# Patient Record
Sex: Male | Born: 1960 | Race: White | Hispanic: No | Marital: Married | Smoking: Current every day smoker
Health system: Southern US, Community
[De-identification: ages and names within clinical notes are randomized; demographics above are authoritative.]

## PROBLEM LIST (undated history)

## (undated) DIAGNOSIS — J189 Pneumonia, unspecified organism: Secondary | ICD-10-CM

## (undated) DIAGNOSIS — E7800 Pure hypercholesterolemia, unspecified: Secondary | ICD-10-CM

## (undated) DIAGNOSIS — Z860100 Personal history of colon polyps, unspecified: Secondary | ICD-10-CM

## (undated) DIAGNOSIS — K5792 Diverticulitis of intestine, part unspecified, without perforation or abscess without bleeding: Secondary | ICD-10-CM

## (undated) DIAGNOSIS — M549 Dorsalgia, unspecified: Secondary | ICD-10-CM

## (undated) DIAGNOSIS — K625 Hemorrhage of anus and rectum: Secondary | ICD-10-CM

## (undated) DIAGNOSIS — I10 Essential (primary) hypertension: Secondary | ICD-10-CM

## (undated) DIAGNOSIS — M2550 Pain in unspecified joint: Secondary | ICD-10-CM

## (undated) DIAGNOSIS — Z720 Tobacco use: Secondary | ICD-10-CM

## (undated) DIAGNOSIS — K2970 Gastritis, unspecified, without bleeding: Secondary | ICD-10-CM

## (undated) DIAGNOSIS — I6523 Occlusion and stenosis of bilateral carotid arteries: Secondary | ICD-10-CM

## (undated) DIAGNOSIS — G4700 Insomnia, unspecified: Secondary | ICD-10-CM

## (undated) DIAGNOSIS — M1990 Unspecified osteoarthritis, unspecified site: Secondary | ICD-10-CM

## (undated) DIAGNOSIS — F32A Depression, unspecified: Secondary | ICD-10-CM

## (undated) DIAGNOSIS — K219 Gastro-esophageal reflux disease without esophagitis: Secondary | ICD-10-CM

## (undated) DIAGNOSIS — I739 Peripheral vascular disease, unspecified: Secondary | ICD-10-CM

## (undated) DIAGNOSIS — G4733 Obstructive sleep apnea (adult) (pediatric): Secondary | ICD-10-CM

## (undated) DIAGNOSIS — J309 Allergic rhinitis, unspecified: Secondary | ICD-10-CM

## (undated) DIAGNOSIS — I2510 Atherosclerotic heart disease of native coronary artery without angina pectoris: Secondary | ICD-10-CM

## (undated) DIAGNOSIS — F329 Major depressive disorder, single episode, unspecified: Secondary | ICD-10-CM

## (undated) DIAGNOSIS — I219 Acute myocardial infarction, unspecified: Secondary | ICD-10-CM

## (undated) HISTORY — DX: Tobacco use: Z72.0

## (undated) HISTORY — DX: Obstructive sleep apnea (adult) (pediatric): G47.33

## (undated) HISTORY — PX: CARPAL TUNNEL RELEASE: SHX101

## (undated) HISTORY — DX: Allergic rhinitis, unspecified: J30.9

## (undated) HISTORY — DX: Pure hypercholesterolemia, unspecified: E78.00

## (undated) HISTORY — DX: Gastro-esophageal reflux disease without esophagitis: K21.9

## (undated) HISTORY — DX: Peripheral vascular disease, unspecified: I73.9

## (undated) HISTORY — DX: Dorsalgia, unspecified: M54.9

## (undated) HISTORY — DX: Hemorrhage of anus and rectum: K62.5

## (undated) HISTORY — PX: TONSILLECTOMY: SUR1361

## (undated) HISTORY — PX: NASAL SEPTOPLASTY W/ TURBINOPLASTY: SHX2070

## (undated) HISTORY — DX: Diverticulitis of intestine, part unspecified, without perforation or abscess without bleeding: K57.92

## (undated) HISTORY — DX: Occlusion and stenosis of bilateral carotid arteries: I65.23

## (undated) HISTORY — PX: CORONARY ANGIOPLASTY: SHX604

## (undated) HISTORY — DX: Gastritis, unspecified, without bleeding: K29.70

## (undated) HISTORY — DX: Insomnia, unspecified: G47.00

## (undated) HISTORY — DX: Atherosclerotic heart disease of native coronary artery without angina pectoris: I25.10

## (undated) HISTORY — DX: Essential (primary) hypertension: I10

## (undated) HISTORY — PX: ADENOIDECTOMY: SUR15

## (undated) HISTORY — PX: CARDIAC CATHETERIZATION: SHX172

## (undated) HISTORY — PX: COLONOSCOPY: SHX174

---

## 1997-09-13 ENCOUNTER — Ambulatory Visit (HOSPITAL_BASED_OUTPATIENT_CLINIC_OR_DEPARTMENT_OTHER): Admission: RE | Admit: 1997-09-13 | Discharge: 1997-09-13 | Payer: Self-pay | Admitting: General Surgery

## 2000-11-30 ENCOUNTER — Encounter: Admission: RE | Admit: 2000-11-30 | Discharge: 2001-02-28 | Payer: Self-pay | Admitting: *Deleted

## 2001-02-24 ENCOUNTER — Encounter (INDEPENDENT_AMBULATORY_CARE_PROVIDER_SITE_OTHER): Payer: Self-pay | Admitting: *Deleted

## 2001-02-24 ENCOUNTER — Ambulatory Visit (HOSPITAL_BASED_OUTPATIENT_CLINIC_OR_DEPARTMENT_OTHER): Admission: RE | Admit: 2001-02-24 | Discharge: 2001-02-25 | Payer: Self-pay | Admitting: Otolaryngology

## 2001-08-30 ENCOUNTER — Encounter: Payer: Self-pay | Admitting: Emergency Medicine

## 2001-08-30 ENCOUNTER — Inpatient Hospital Stay (HOSPITAL_COMMUNITY): Admission: EM | Admit: 2001-08-30 | Discharge: 2001-09-01 | Payer: Self-pay | Admitting: Emergency Medicine

## 2002-01-24 ENCOUNTER — Inpatient Hospital Stay (HOSPITAL_COMMUNITY): Admission: AD | Admit: 2002-01-24 | Discharge: 2002-01-25 | Payer: Self-pay | Admitting: *Deleted

## 2002-01-25 ENCOUNTER — Encounter: Payer: Self-pay | Admitting: *Deleted

## 2002-06-27 ENCOUNTER — Encounter: Payer: Self-pay | Admitting: Emergency Medicine

## 2002-06-27 ENCOUNTER — Inpatient Hospital Stay (HOSPITAL_COMMUNITY): Admission: EM | Admit: 2002-06-27 | Discharge: 2002-06-28 | Payer: Self-pay | Admitting: Emergency Medicine

## 2002-10-17 ENCOUNTER — Ambulatory Visit (HOSPITAL_COMMUNITY): Admission: RE | Admit: 2002-10-17 | Discharge: 2002-10-17 | Payer: Self-pay | Admitting: *Deleted

## 2002-10-17 ENCOUNTER — Encounter: Payer: Self-pay | Admitting: *Deleted

## 2003-04-04 ENCOUNTER — Observation Stay (HOSPITAL_COMMUNITY): Admission: EM | Admit: 2003-04-04 | Discharge: 2003-04-05 | Payer: Self-pay | Admitting: Emergency Medicine

## 2003-10-06 ENCOUNTER — Emergency Department (HOSPITAL_COMMUNITY): Admission: EM | Admit: 2003-10-06 | Discharge: 2003-10-06 | Payer: Self-pay | Admitting: Emergency Medicine

## 2003-10-09 ENCOUNTER — Ambulatory Visit (HOSPITAL_COMMUNITY): Admission: RE | Admit: 2003-10-09 | Discharge: 2003-10-09 | Payer: Self-pay | Admitting: Family Medicine

## 2003-10-26 ENCOUNTER — Ambulatory Visit (HOSPITAL_COMMUNITY): Admission: RE | Admit: 2003-10-26 | Discharge: 2003-10-26 | Payer: Self-pay | Admitting: Orthopedic Surgery

## 2004-07-04 ENCOUNTER — Encounter: Admission: RE | Admit: 2004-07-04 | Discharge: 2004-07-04 | Payer: Self-pay | Admitting: *Deleted

## 2004-07-17 ENCOUNTER — Encounter (INDEPENDENT_AMBULATORY_CARE_PROVIDER_SITE_OTHER): Payer: Self-pay | Admitting: Specialist

## 2004-07-17 ENCOUNTER — Ambulatory Visit (HOSPITAL_COMMUNITY): Admission: RE | Admit: 2004-07-17 | Discharge: 2004-07-17 | Payer: Self-pay | Admitting: *Deleted

## 2005-01-04 ENCOUNTER — Emergency Department (HOSPITAL_COMMUNITY): Admission: EM | Admit: 2005-01-04 | Discharge: 2005-01-04 | Payer: Self-pay | Admitting: Emergency Medicine

## 2007-02-22 ENCOUNTER — Encounter: Admission: RE | Admit: 2007-02-22 | Discharge: 2007-02-22 | Payer: Self-pay | Admitting: Cardiology

## 2008-02-09 ENCOUNTER — Emergency Department (HOSPITAL_BASED_OUTPATIENT_CLINIC_OR_DEPARTMENT_OTHER): Admission: EM | Admit: 2008-02-09 | Discharge: 2008-02-09 | Payer: Self-pay | Admitting: Emergency Medicine

## 2008-04-24 ENCOUNTER — Inpatient Hospital Stay (HOSPITAL_COMMUNITY): Admission: RE | Admit: 2008-04-24 | Discharge: 2008-04-25 | Payer: Self-pay | Admitting: Interventional Cardiology

## 2008-04-24 ENCOUNTER — Inpatient Hospital Stay (HOSPITAL_BASED_OUTPATIENT_CLINIC_OR_DEPARTMENT_OTHER): Admission: RE | Admit: 2008-04-24 | Discharge: 2008-04-24 | Payer: Self-pay | Admitting: Cardiology

## 2010-10-14 NOTE — Cardiovascular Report (Signed)
NAME:  Thomas Dudley, Thomas Dudley NO.:  [id number]   MEDICAL RECORD NO.:  [id number]          PATIENT TYPE:  INP   LOCATION:  6524                         FACILITY:  [facility]   PHYSICIAN:  Armanda Magic, M.D.     DATE OF BIRTH:  04/18/1961   DATE OF PROCEDURE:  04/24/2008  DATE OF DISCHARGE:                            CARDIAC CATHETERIZATION   REFERRING PHYSICIAN:  Vikki Ports, MD   PROCEDURES:  1. Left heart catheterization.  2. Coronary angiography.  3. Left ventriculography.   OPERATOR:  Armanda Magic, MD   INDICATIONS:  Chest pain consistent with angina and known history of CAD  in the past with PCI of obtuse marginal branch in 2004.   COMPLICATIONS:  None.   IV ACCESS:  Via right femoral artery 6-French sheath.   IV MEDICATIONS:  Versed 2 mg, fentanyl 25 mcg, IV nitroglycerin drip at  10 mcg per minute, aspirin 325 mg, and IV heparin 3000 units IV bolus.   PROCEDURE:  This is a very pleasant 50 year old male who [facility] a fasting, nonsedated state.  Informed consent was  obtained.  The patient was connected to continuous heart rate, pulse  oximetry monitoring, and intermittent blood pressure monitoring.  The  right groin was prepped and draped in a sterile fashion.  Xylocaine 1%  was used for local anesthesia.  Using a modified Seldinger technique, a  4-French sheath was placed in the right femoral artery.  Under  fluoroscopic guidance, a 4-French JL4 catheter was placed in left  coronary artery.  Multiple cine films were taken in a 30-degree RAO and  40-degree LAO views.  This catheter was then exchanged out over a  guidewire for a 4-French 3D RCA catheter, which successfully engaged the  right coronary ostium.  Multiple cine films were taken at 30-degree RAO  and 40-degree LAO views.  This catheter was then exchanged out over a  guidewire for 6-French angled pigtail catheter, which was placed under a  fluoroscopic guidance in the  left ventricular cavity.  Left  ventriculography was performed in a 30-degree RAO view using total of 30  mL of contrast at 15 mL per second.  At the end of procedure, the  catheter was pulled back across the aortic valve with no significant  gradient noted.  The catheter was then removed over a guidewire.  The  sheath was sewn in place, and the patient was taken up to the main cath  lab for PCI of the left circumflex/PDA system.  The patient tolerated  the procedure well.   RESULTS:  Left main coronary artery is widely patent and trifurcates  into left anterior descending artery, ramus branch, and left circumflex  artery.   Left anterior descending artery is widely patent throughout the course  of the apex giving rise to a first diagonal branch and septal  perforator, both of which are small, but widely patent.   The ramus is a moderate-sized vessel and is widely patent.   The left circumflex is widely patent in its proximal portion.  It gives  rise  to a very large obtuse marginal one branch.  The distal circumflex  after the takeoff of the obtuse marginal branch traverses the AV groove  and is widely patent.  The obtuse marginal branch has a an 80-90%  stenosis just at the bifurcation of 2 daughter branches.  The superior  branch has 90% stenosis at the ostium of the inferior branch and 80-90%  stenosis at the ostium.   The right coronary artery has a 30% proximal narrowing and then gives  rise to a large acute marginal branch, which is widely patent.  The  ongoing RCA is widely patent distally.  It then bifurcates into  posterior descending artery and posterior lateral artery.  The posterior  lateral artery is widely patent.  The posterior descending artery has a  proximal 40% stenosis at a branch and then in the midportion has a 70-  80% narrowing.   Left ventriculography shows normal LV systolic function, ejection  fraction is 60%.  There are no regional wall motion  abnormalities.  LV  pressure 153/11 mmHg, aortic pressure 145/83 mmHg, and LVEDP 21 mmHg.   ASSESSMENT:  1. Two-vessel obstructive coronary artery disease of the posterior      descending artery and obtuse marginal branch.  Of note, the patient      has had a previous percutaneous coronary intervention of the obtuse      marginal in 2004.  2. Normal left ventricular function.  3. Unstable angina pectoris.   PLAN:  We will admit to the Inpatient Cath Lab for PCI of the left  circumflex/PDA per Dr. Eldridge Dace.  He has been loaded with aspirin 325 mg  and given 3000 units of IV heparin.  I have counseled the patient on the  need to quit his smoking.  In regards to his medications, he will  continue on his aspirin, simvastatin, hydrochlorothiazide, and will most  likely be on Plavix after his PCI.       Armanda Magic, M.D.  Electronically Signed     TT/MEDQ  D:  04/24/2008  T:  04/25/2008  Job:  [id number]   cc:   Vikki Ports, M.D.

## 2010-10-14 NOTE — Discharge Summary (Signed)
NAME:  COSTA, JHA NO.:  [id number]   MEDICAL RECORD NO.:  [id number]          PATIENT TYPE:  INP   LOCATION:  6524                         FACILITY:  [facility]   PHYSICIAN:  Armanda Magic, M.D.     DATE OF BIRTH:  05/09/1961   DATE OF ADMISSION:  04/24/2008  DATE OF DISCHARGE:  04/25/2008                               DISCHARGE SUMMARY   DISCHARGE DIAGNOSES:  1. Coronary artery disease, status post percutaneous coronary      intervention to the second obtuse marginal artery and right      coronary artery on April 24, 2008.  2. Tobacco abuse, smoking cessation counseling.  3. Dyslipidemia.  4. Hypertension.  5. Gastritis.  6. Head in 1965.   HOSPITAL COURSE:  Mr. Culp is a 50 year old male patient who went to  the office on the day of admission complaining of chest pain that had  been intermittent over the past week.  His symptoms were felt to be so  significant that given his known coronary artery disease, we felt he  should be admitted to the hospital.   LAB STUDIES DURING THIS HOSPITAL STAY:  Include a CBC showing a white  count 12.3, hemoglobin 15.4, hematocrit 44.4, and platelets 297.  Sodium  135, potassium 3.1, repleted, glucose 123, BUN 9, and creatinine 0.81.   The patient then underwent cardiac catheterization that showed an RCA  lesion of 80% and then in the OM-2 of 80%.  The vessels underwent  Endeavor stent placement and during the procedure distal dissection in  the right coronary artery with linear dye staining was noted and then an  Endeavor stent was placed in that area.  This was performed  successfully.  The patient needs to remain on aspirin and Plavix for at  least 1 year.   He remained in the hospital overnight and the following day he was felt  to be ready for discharge to home.   DISCHARGE MEDICATIONS:  1. Plavix 75 mg a day.  2. Lisinopril 10 mg a day.  3. Aspirin 325 mg a day.  4. Vytorin 10/40 one tablet daily.  5.  Hydrochlorothiazide 25 mg a day.  6. Wellbutrin SR 150 mg a day.  7. Nexium 40 mg a day.  8. Sublingual nitroglycerin p.r.n. chest pain.   The patient is to remain on a low-sodium, heart-healthy diet.  Increase  activity slowly.  Clean cath site gently with soap and water.  No  scrubbing.  Returning to work on May 02, 2008, the patient will need  to see Dr. Mayford Knife first before willing to back to work and the patient  will be seen on May 01, 2008, at 11:30 a.m.      Guy Franco, P.A.      Armanda Magic, M.D.  Electronically Signed    LB/MEDQ  D:  04/25/2008  T:  04/25/2008  Job:  [id number]   cc:   Armanda Magic, M.D.

## 2010-10-14 NOTE — Cardiovascular Report (Signed)
NAME:  Thomas Dudley, Thomas Dudley NO.:  [id number]   MEDICAL RECORD NO.:  [id number]          PATIENT TYPE:  INP   LOCATION:  6524                         FACILITY:  [facility]   PHYSICIAN:  Corky Crafts, MDDATE OF BIRTH:  1960-10-17   DATE OF PROCEDURE:  04/24/2008  DATE OF DISCHARGE:                            CARDIAC CATHETERIZATION   REFERRING PHYSICIAN:  Armanda Magic, MD   PROCEDURES PERFORMED:  1. Percutaneous coronary intervention of obtuse marginal artery 2.  2. Percutaneous coronary intervention of the posterior descending      artery.  3. Percutaneous coronary intervention of the distal right coronary      artery.   OPERATOR:  Corky Crafts, MD   INDICATION:  Unstable angina.   PROCEDURE NOTE:  The diagnostic procedure was performed by Dr. Mayford Knife  showing significant stenosis in the OM-2 and in the PDA.  A CLS 3.5  guiding catheter was placed into the left main and a Prowater wire was  placed across the lesion in the OM-2.  A 2.0 x 12 mm Apex was then  placed across the lesion and inflated to 10 atmospheres for 20 seconds  and then again to 12 atmospheres for 40 seconds.  A 2.5 x 14 Endeavor  was placed across the lesion and inflated to 9 atmospheres for 25  seconds.  The stent was postdilated with a 2.5 x 8 Quantum Maverick to  18 atmospheres for 35 seconds and then to 18 atmospheres for 22 seconds,  more proximally.  There was an excellent angiographic result with no  residual stenosis.  A JR4 guiding catheter was then placed into the  right coronary artery and the same Prowater wire was placed across the  lesion in the PDA.  A 2.0 x 12 Apex was used to predilate the lesion,  inflated to 14 atmospheres for 40 seconds and then again to 14  atmospheres for 15 seconds.  A 2.5 x 24 Endeavor stent was placed across  the lesion and deployed at 12 atmospheres for 35 seconds.  The proximal  portion of the stent was postdilated with 3.0 x 20 mm Quantum  Maverick  and inflated to 16 atmospheres for 20 seconds.  On what we thought would  be the final pictures of the angiogram, there was a linear dissection  noted in the distal right coronary artery with vessel staining.  This  was before the bifurcation of the PLA and the PDA.  It is unclear how  this dissection came as there were no balloon inflations in that area.  The dissected area was then directly stented with a 3.5 x 15 Endeavor  stent and inflated to 10 atmospheres for 28 seconds.  The stent was  postdilated with a 3.75 x 12 Quantum Maverick, inflated to 14  atmospheres for 45 seconds.  There was an excellent angiographic result  with no residual stenosis.   IMPRESSION:  1. Successful drug-eluting stent to the obtuse marginal artery 2,      posterior descending artery, distal right coronary artery.  The      obtuse marginal artery 2 received  a 2.5 x 14 Endeavor, the      posterior descending artery a 2.5 x 24 Endeavor, postdilated to      greater than 3 mm in diameter and the distal right received a 3.5 x      15 Endeavor, postdilated to greater than 3.75 mm in diameter.   RECOMMENDATIONS:  Continue aspirin and Plavix for at least a year.  He  needs to stop smoking.  He should continue with secondary prevention,  including lipid-lowering therapy.      Corky Crafts, MD  Electronically Signed     JSV/MEDQ  D:  04/24/2008  T:  04/25/2008  Job:  [id number]

## 2010-10-17 NOTE — H&P (Signed)
NAME:  Thomas Dudley, Thomas Dudley NO.:  [id number]   MEDICAL RECORD NO.:  [id number]                   PATIENT TYPE:  EMS   LOCATION:  MAJO                                 FACILITY:  [facility]   PHYSICIAN:  Colleen Can. Deborah Chalk, M.D.            DATE OF BIRTH:  14-Sep-1960   DATE OF ADMISSION:  06/27/2002  DATE OF DISCHARGE:                                HISTORY & PHYSICAL   REASON FOR ADMISSION:  Acute onset of chest pain.   HISTORY OF PRESENT ILLNESS:  The patient is a 50 year old white male with  previous catheterization and stent to the first obtuse marginal in April  2003.  He has similar symptoms at that time to his current symptoms.  In  August 2003, he was basically asymptomatic but had an abnormal stress study,  had repeat catheterization without significant abnormalities.  He has taken  an occasional nitroglycerin since that time.   He basically felt bad over the last one to two days.  He may have done an  excess amount of shoveling of snow, and he basically did not feel well on  his way to work.  He had the onset of severe substernal chest pain radiating  to his neck and his arms. There was no diaphoresis, but he was nauseated and  lightheaded.  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  He had 8/10 chest pain with relief with morphine and  nitroglycerin in the ER.   PAST MEDICAL HISTORY:  He smoked cigarettes two packs a day for 20 years, no  alcohol.   CURRENT MEDICATIONS:  Doses unknown.  Lisinopril, hydrochlorothiazide,  Plavix, Wellbutrin, and aspirin.   ALLERGIES:  NEOSPORIN causes severe blisters and has led to surgery in the  past.   PAST SURGICAL HISTORY:  Plastic surgery on his left head because of  Neosporin injury.   PAST MEDICAL HISTORY:  He has a history of hypertension for several years.  No diabetes.   SOCIAL HISTORY:  He is married for four years.  He has five children with  two marriages.   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  He also has a  second job.   FAMILY HISTORY:  His father died at age 52 of cirrhosis of the liver.  His  mother died at age 39 of heart disease.  He has a sister and brother with  diabetes.  He is the fourth of six children.   REVIEW OF SYSTEMS:  He has seen some blood on toilet tissue.  He has had  some right flank pain associated with nausea on three episodes.   PHYSICAL EXAMINATION:  VITAL SIGNS:  Blood pressure 127/75, heart rate 92,  respiratory rate 20.  HEENT:  Negative.  NECK:  Supple without bruits.  LUNGS:  Clear.  HEART: Regular rate and rhythm.  ABDOMEN:  Soft, nontender.  No organomegaly.  EXTREMITIES:  Without edema.   LABORATORY DATA:  EKG shows nonspecific ST and T wave changes.   Chest x-ray is pending.   OVERALL IMPRESSION:  Chest pain, known coronary artery disease with history  of stent in posterior circulation.   PLAN:  Will proceed on with acute catheterization.                                               Colleen Can. Deborah Chalk, M.D.    SNT/MEDQ  D:  06/27/2002  T:  06/27/2002  Job:  [id number]

## 2010-10-17 NOTE — Cardiovascular Report (Signed)
NAME:  Thomas Dudley, Thomas Dudley NO.:  [id number]   MEDICAL RECORD NO.:  [id number]                   PATIENT TYPE:  INP   LOCATION:  1827                                 FACILITY:  [facility]   PHYSICIAN:  Colleen Can. Deborah Chalk, M.D.            DATE OF BIRTH:  1961-05-06   DATE OF PROCEDURE:  06/27/2002  DATE OF DISCHARGE:                              CARDIAC CATHETERIZATION   HISTORY:  The patient had previous stent placed in his left circumflex  marginal vessel in April 2003.  He is referred now for acute  catheterization, presenting with 10/10 chest pain, similar to his previous  symptoms in April.  He has continued to smoke cigarettes.   PROCEDURE:  Left heart catheterization with selective coronary angiography,  left ventricular angiography.   TYPE AND SITE OF ENTRY:  Percutaneous right femoral artery.   CATHETERS:  The 6-French 4-curved Judkins right and left coronary catheters,  6-French pigtail ventriculographic catheter.   CONTRAST MATERIAL:  Omnipaque.   MEDICATIONS GIVEN PRIOR TO PROCEDURE:  Heparin 500 units IV, and morphine  sulfate.   MEDICATIONS GIVEN DURING PROCEDURE:  Versed 3 mg IV.   COMMENTS:  The patient tolerated the procedure well.   HEMODYNAMIC DATA:  1. The aortic pressure was 112/78.  2. LV was 115/7-13.  3. There was no aortic valve gradient noted on pullback.   ANGIOGRAPHIC DATA:  1. The left main coronary artery is normal.  2. Left circumflex:  The stent is widely patent.  There are irregularities     in the proximal left circumflex.  There is an irregularity in the obtuse     marginal distal to the stent with approximately 20-30% narrowing.  3. Left anterior descending:  The left anterior descending is a moderate-     sized vessel that basically bifurcates.  In the large diagonal vessel,     there is approximately 30-50% narrowing.  There are irregularities in the     smaller continuation branch of the left anterior  descending.  4. Right coronary artery:  The right coronary artery is a large dominant     vessel.  There are scattered irregularities in the right coronary artery,     and a 30-50% narrowing in the body of the posterior descending.  There is     no posterolateral obstructive disease.  5. Left ventricular angiogram:  A left ventricular angiogram is performed in     the RAO position.  Overall cardiac size and silhouette are normal.     Regional wall motion is normal.   OVERALL IMPRESSION:  1. Normal left ventricular function.  2. Mild diffuse three-vessel coronary atherosclerosis.  3. Persistent patency of the stent in the left circumflex coronary.   DISCUSSION:  In light of these findings, will continue medical management  for the patient with additional recommendations for smoking cessation and  lipid management, if tolerated.  Colleen Can. Deborah Chalk, M.D.    SNT/MEDQ  D:  06/27/2002  T:  06/27/2002  Job:  [id number]   cc:   Meade Maw, M.D.  [address]  [place]  [place] [address]  Fax: [phone]

## 2010-10-17 NOTE — Cardiovascular Report (Signed)
NAME:  Thomas Dudley, Thomas Dudley NO.:  [id number]   MEDICAL RECORD NO.:  [id number]                   PATIENT TYPE:  INP   LOCATION:  2018                                 FACILITY:  [facility]   PHYSICIAN:  Helen A. Fraser Din, M.D.              DATE OF BIRTH:  03-09-1961   DATE OF PROCEDURE:  DATE OF DISCHARGE:  01/25/2002                              CARDIAC CATHETERIZATION   INDICATIONS FOR PROCEDURE:  Ongoing chest pain with T wave changes in a 50-  year-old gentleman with known coronary artery disease status post PTCA of  the obtuse marginal.   DESCRIPTION OF PROCEDURE:  After obtaining written informed consent, the  patient was brought to the cardiac catheterization lab in the postabsorptive  state.  The right groin was prepped and draped in the usual sterile fashion.  Local anesthesia was achieved using 1% Xylocaine.  The patient received 3 mg  of Versed IV for sedation.  Single plane ventriculogram was performed in the  RAO position using a #6 French straight pigtail curved catheter.  Coronary  angiography was performed using a JL4/JR4 Judkins catheter.  All catheter  exchanges were made over a guidewire.  Following the procedure, there was no  identifiable disease.  The patient was transferred to the holding area.  The  hemostasis sheath was removed.  Hemostasis was achieved using digital  pressure.   FINDINGS:  1. There was no gradient noted on pullback.  2. A single plane ventriculogram revealed normal wall motion, ejection     fraction of approximately 60%.  There was no mitral regurgitation.  3. Coronary angiography     A. The left main coronary artery bifurcates.  There is no significant        disease in the left main coronary artery.     B. Left anterior descending:  The left anterior descending is a moderate        to large-sized vessel.  It does not supply the apical region.  The        left anterior descending gives rise to a large diagonal  which        bifurcates onto the left lateral wall.  There is no significant        disease in the large diagonal.  There are luminal irregularities up to        30% in the proximal LAD after the first septal perforator.     C. Circumflex vessel:  The circumflex vessel is a moderate-sized vessel.        The previously stented region remains patent.     D. Right coronary artery:  The right coronary artery is a large dominant        artery and provides circulation to the apical branch.  There is a 40-        50% lesion noted in the proximal PDA.   FINAL IMPRESSION:  1.  Preserved systolic function, ejection fraction of 60%.  2. Previously stented obtuse marginal remains patent.  3. There has been no progression of the lesion in the right coronary artery.   RECOMMENDATIONS:  Smoking cessation has been discussed at length with the  patient.  He will continue with aspirin.  His LDL goal is less than 100.  Will continue with Altace for his hypertension.                                               Helen A. Fraser Din, M.D.    HAP/MEDQ  D:  01/25/2002  T:  01/25/2002  Job:  [id number]

## 2010-10-17 NOTE — Cardiovascular Report (Signed)
[place]  Patient:    Thomas Dudley, Thomas Dudley Visit Number: [id number] MRN: [id number]          Service Type: MED Location: [facility] [id number] [id number] Attending Physician:  Mora Appl Dictated by:   Darci Needle, M.D. Proc. Date: 08/30/01 Admit Date:  08/30/2001 Discharge Date: 09/01/2001   CC:         [name] A. Fraser Din, M.D.  Jamesetta Geralds, M.D., [facility]   Cardiac Catheterization  INDICATION: Acute lateral wall myocardial infarction.  PROCEDURES PERFORMED: 1. Left heart catheterization. 2. Selective coronary angiography. 3. Left ventriculography. 4. Percutaneous transluminal coronary angioplasty and stent of obtuse marginal    #1.  DESCRIPTION OF PROCEDURE: After informed consent, the patient was brought to the catheterization lab. He received informed consent in the emergency room. The right ileofemoral region was sterilely prepped and draped. A 7 French sheath was placed in the right femoral artery using the modified Seldinger technique. A 6 French A2 Multipurpose catheter was used for hemodynamic recordings, left ventriculography by hand injection, and selective right coronary angiography. We used a #4 6 [place], left Judkins catheter for left coronary angiography. The patient tolerated the diagnostic procedure without complications. We identified the culprit lesion for the patients acute infarction to be the second obtuse marginal. We upgraded to a 7 [place], #4, left Judkins, guide catheter. We used a BMW 0.014, 190 cm, long wire and a 10 mm, long 2.5 mm diameter CrossSail balloon to perform angioplasty. Two balloon inflations were performed. We then stented the proximal portion of the second obtuse marginal using an 8 mm, long, Express II stent to 15 atmospheres, which was an affective diameter of 2.2 mm. The patient had reproducible chest discomfort with each balloon inflation.  TIMI-3 flow was noted post  intervention.  The patient received intravenous heparin in the emergency room, 5000 units. ACT upon arrival in the catheterization lab was 265 seconds.  A double bolus followed by an infusion of Integrilin was then given. The patient had received 150 mg of Plavix in the emergency room. The procedure was terminated. No complications were noted and his clinical condition was improved at completion of the procedure.  RESULTS:  I: HEMODYNAMIC DATA:     a. The aortic pressure 117/80 mmHg.     b. Left ventricular pressure 117/15 mmHg.  II: LEFT VENTRICULOGRAPHY: The left ventricle is normal in size. There was mild anterolateral hypokinesis. EF is greater than 60%.  III: SELECTIVE CORONARY ANGIOGRAPHY:     a. Left main: Normal.     b. Left anterior descending coronary artery: The LAD is large. It        reaches the left ventricular apex anteriorly. It does not wrap        around the apex. There is a large first diagonal that arises from        the LAD in the mid vessel beyond the first septal perforator. The        origin of the continuation of the LAD beyond this diagonal may contain        up to 30% narrowing. The diagonal is large, bifurcates on the left        lateral wall and is free of any significant obstruction. No        irregularities are noted throughout the LAD but no high-grade stenosis        is seen. After the first septal perforator, there is perhaps 30-40%  narrowing.     c. Circumflex artery: The circumflex is moderate in size. It gives origin        to three obtuse marginal branches. The first obtuse marginal branch        is patent. It is relatively small. The second obtuse marginal is large        and bifurcates. It contains a 99% thrombus containing proximal        stenosis. The third obtuse marginal is relatively small. The second        obtuse marginal beyond its bifurcation contains a 30-40% region of        narrowing.     d. Right coronary artery: The  right coronary artery is dominant. The        PDA wraps around the left ventricular apex. A large left ventricular        branch also arises from the right coronary. Irregularities are noted        within the right coronary. A 50% stenosis is noted in the proximal        PDA.  CONCLUSIONS: 1. Acute myocardial infarction due to obtuse marginal #2 obstruction/acute    thrombus. 2. Successful percutaneous intervention with reduction in stenosis on the    second obtuse marginal from 99% to 0% following stenting. TIMI grade    3 flow was maintained. 3. Mild left anterior descending and moderate posterior descending artery    disease. 4. Normal overall left ventricular function. Mild anterolateral wall motion    abnormality.  RECOMMENDATIONS: Aggressive risk factor modification, Integrilin x18 hours, aspirin and Plavix, both at three weeks, then aspirin thereafter. Low-dose beta blocker therapy. Further management per Dr. Fraser Din. Dictated by:   Darci Needle, M.D. Attending Physician:  Meade Maw A DD:  08/30/01 TD:  08/30/01 Job: [id number]

## 2010-10-17 NOTE — H&P (Signed)
NAME:  Thomas Dudley, Thomas Dudley NO.:  [id number]   MEDICAL RECORD NO.:  [id number]                   PATIENT TYPE:  INP   LOCATION:  2018                                 FACILITY:  [facility]   PHYSICIAN:  Helen A. Fraser Din, M.D.              DATE OF BIRTH:  21-Feb-1961   DATE OF ADMISSION:  01/24/2002  DATE OF DISCHARGE:                                HISTORY & PHYSICAL   INDICATION FOR ADMISSION:  Unstable angina.   HISTORY:  The patient is a 50 year old male who is known to me from a  previous admission for acute lateral wall myocardial infarction. He  underwent left heart catheterization in April 2003 and subsequent PTCA of  the  first obtuse marginal. His ejection fraction was preserved at 60%. He  was  noted to have mild anterolateral hypokinesis and his peak CK was 159  with an MB fraction of 5.5. His last evaluation was on January 18, 2002. At  this time he noticed that he was  having  two to  three episodes of chest  pain. Since his last visits he had used three sublingual nitroglycerin  without resolution of the  pain. He continues to  smoke.   He presents today to the  office for a stress Cardiolite. He was  noted to  have acute ST elevation in 1 and AVL. The chest pain and ST changes resolved  with one sublingual nitroglycerin spray. In view of these changes, it is  elected to admit and go  directly for coronary angiography.   PAST MEDICAL HISTORY:  1. As noted above.  2. Dyslipidemia, last serum cholesterol revealing a triglyceride of 331, HDL     42 and LDL of 103. He was  initiated on Zocor 10 mg q.d.  3. Hypertension with good control. He remains on Altace.   PAST SURGICAL HISTORY:  Includes tonsillectomy and adenoidectomy.   SOCIAL HISTORY:  The patient is married. He has three biological children  and two step children. He works three jobs, working one for a Technical sales engineer, and unloading trucks. Occasional beer. Smokes two packs per day.  Has been unable to stop smoking despite repeated advice to discontinue.   ALLERGIES:  NEOSPORIN RESULTS IN  SEVERE TOPICAL REACTION.   REVIEW OF SYMPTOMS:  As noted above. He has had no  orthopnea, no PND, no  tachy arrhythmia.   PHYSICAL EXAMINATION:  VITAL SIGNS:  Initial blood pressure  160/100. He was  given 2 sublingual nitroglycerin, heart rate is ranging in the 80s.  HEENT: Unremarkable.  NECK: He has good carotid upstrokes and no carotid bruits.  CORONARY:  Exam reveals breath sounds which are equal and clear to  auscultation. No use of accessory muscles.  Cardiovascular exam reveals a  normal S1, S2, regular rate and rhythm. There are no murmurs, rubs, gallops  noted.  ABDOMEN:  Soft, benign, nontender, no unusual  bruits or pulsations are  noted.  EXTREMITIES:  Reveal no peripheral edema.  SKIN:  Warm and dry.  NEUROLOGIC:  Nonfocal. Motor is 5/5 throughout.   LABORATORY DATA:  His initial EKG revealed ST elevation in 1 and AVL. There  is T-wave inversion in V6. Following 2 sublingual nitroglycerin, the acute  ST elevation in AVL has resolved. He continues to have T-wave in the V6  lead.   IMPRESSION:  1. Coronary artery disease, status post PTCA of the  obtuse marginal with     residual disease in the PDA. He presents with increasing episodes of     angina. He will be admitted. Options were discussed with the patient. He     wishes to proceed directly with coronary angiography. He will be started     on Lovenox, continue with his current medications for hypertension, which     include Toprol 50 mg and Altace 10 mg daily.   1. Hypertension. Blood pressure is not well controlled as noted above. Will     increase his Toprol to 100 mg and add hydrochlorothiazide.   1. Ongoing tobacco use. Smoking cessation was once again discussed with the     patient.                                               Helen A. Fraser Din, M.D.    HAP/MEDQ  D:  01/24/2002  T:  01/25/2002   Job:  [id number]   cc:   Janifer Adie, M.D.

## 2010-10-17 NOTE — Discharge Summary (Signed)
NAME:  Thomas Dudley, Thomas Dudley NO.:  [id number]   MEDICAL RECORD NO.:  [id number]                   PATIENT TYPE:  INP   LOCATION:  2901                                 FACILITY:  [facility]   PHYSICIAN:  Meade Maw, M.D.                 DATE OF BIRTH:  [birth year]   DATE OF ADMISSION:  06/27/2002  DATE OF DISCHARGE:  06/28/2002                                 DISCHARGE SUMMARY   ADMISSION DIAGNOSES:  1. Chest pain, rule out myocardial infarction.  2. Known coronary artery disease, status post first obtuse marginal stenting     in April 2003.  3. Hyperlipidemia.  4. Tobacco use.  5. Hypertension.   DISCHARGE DIAGNOSES:  1. Chest pain, resolved, probable coronary spasm.  Coronaries patent,     myocardial infarction ruled out.  2. Known coronary artery disease, status post first obtuse marginal stenting     in April 2003.  3. Hyperlipidemia.  4. Tobacco use.  5. Hypertension.   HISTORY OF PRESENT ILLNESS:  This is a 50 year old white male with a prior  catheterization and stent to the first obtuse marginal in April 2003.  He  had similar symptoms at that time to his current symptoms.  He underwent  repeat cardiac catheterization August 2003 for an abnormal stress test which  revealed no significant abnormalities on catheterization.  He has taken a  couple of nitroglycerin since that time.   Apparently, the patient has felt poorly over the last 1-2 days.  He may have  done an excessive amount of shoveling of snow and he basically did not feel  well on his way to work.  He had onset of severe substernal chest pain  radiating to his neck and arms.  There was no diaphoresis but he was  lightheaded and nauseated.  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  He had 8/10 chest pain with relief  taking morphine and nitroglycerin in the emergency room.   In the emergency room, EKG showed nonspecific ST-T wave changes.   Because of  chest pain with history of stenting one year ago, we will proceed with acute  catheterization by Dr. Deborah Chalk.   PROCEDURE:  Cardiac catheterization by Dr. Deborah Chalk June 27, 2002.   CONSULTATIONS:  None.   HOSPITAL COURSE:  The patient was admitted to [facility] on June 27, 2002 with a diagnosis of acute onset chest pain.  Because of his  symptoms, he was taken urgently to the cardiac catheterization.  Dr. Deborah Chalk  performed cardiac catheterization which revealed:   1. A normal left main.  2. Circumflex with a stent widely patent.  Diffuse proximal irregularities     of the circumflex.  A 20%-30% stenosis distal to the marginal stent.  3. LAD moderate size with a large diagonal vessel having 30%-50% narrowing.  4. RCA is large  and dominant, scattered irregularities, and a 30% narrowing     in the body of the PDA.  There was no posterolateral obstructive disease.     Normal left ventricular function.   In light of these findings, Dr. Deborah Chalk recommended continued medical  management and recommended smoking cessation and lipid management.   On June 28, 2002, the patient remained stable without chest pain.  He was  seen by Dr. Fraser Din, who felt that he was stable for discharge to home.  She  recommended Cardizem CD 180 mg a day for anticoronary vasospasm benefit.  Also, reinstate Zocor at 20 mg a day.  He may return to work on Monday.   LABORATORY DATA:  Laboratory studies at discharge revealed a WBC of 9.8,  hemoglobin 14.8, and platelets 280.  Sodium 131, potassium 3.4, BUN 12,  creatinine 0.9 (potassium will be repleted before discharge).  Cardiac  enzymes negative x2.   DISCHARGE MEDICATIONS:  1. Enteric-coated aspirin 325 mg a day.  2. Cardizem CD 180 a day.  3. Hydrochlorothiazide 25 mg a day.  4. Lisinopril 20 mg a day.  5. Plavix 75 mg a day.  6. Wellbutrin 150 mg b.i.d.  7. Zocor 20 mg a day.  8. Toprol XL 25 mg a day.  9. Nitroglycerin as  needed for chest pain.   DISCHARGE INSTRUCTIONS:  He is to stop smoking.  No strenuous activity or  lifting more than 5 pounds or driving for two days.  He may return to work  on Monday, July 03, 2002, without restriction.   DIET:  Low-fat/low-cholesterol/low-salt diet.   WOUND CARE:  He may shower.   FOLLOW UP:  He is asked to call the office with any problems or questions.  He will follow up with Dr. Fraser Din on Wednesday, February 11 at 12 o'clock.     Georgiann Cocker Jernejcic, P.A.                   Meade Maw, M.D.    TCJ/MEDQ  D:  06/28/2002  T:  06/28/2002  Job:  [id number]   cc:   Janifer Adie, M.D.

## 2010-10-17 NOTE — Discharge Summary (Signed)
[place]  Patient:    Thomas Dudley, Thomas Dudley Visit Number: [id number] MRN: [id number]          Service Type: MED Location: [facility] 2928 01 Attending Physician:  Meade Maw A Dictated by:   Anselm Lis, N.P. Admit Date:  08/30/2001                             Discharge Summary  DATE OF BIRTH:  Dec 22, 1960.  PRIMARY CARE PHYSICIAN:  Jamesetta Geralds, M.D.  CONSULTANTS:  Dr. Verdis Prime.  PROCEDURES: 1. Dr. Verdis Prime, percutaneous transluminal coronary angioplasty,    stenting of obtuse marginal #1, reduction of stenosis from 99% to 0%.    a.  Ejection fraction 60% with mild anterior lateral hypokinesis. Residual    50% posterior descending artery.  DISCHARGE DIAGNOSES: 1. Acute lateral myocardial infarction with spontaneous reperfusion:    a. Very slight bump in cardiac enzymes with peak CK 159, MB fraction    5.5, relative index 3.5.    b. (08/30/01) Percutaneous transluminal coronary angioplasty stent of    obtuse marginal #1 by Dr. Verdis Prime, details as above. 2. Dyslipidemia with serum cholesterol 211, triglycerides 331, HDL 42,    LDL 103. Started on Zocor 10 mg per day and will have follow up    liver function tests, fasting statin profile in six weeks.  The liver    function tests this admission were within normal range. 3. Hypertension: Good control during course of this admission. 4. Tobacco abuse.  Patient met with smoking cessation counselor, ultimately    planning to quit after tapering schedule.  Wife will join the patient in    this attempt.  Smoking cessation counselor plans follow up with patient    after discharge. 5. Hyperglycemia ? stress response with admission glucose 146. Fasting    glucose 08/31/01 was 95.  Hemoglobin A1C was within normal range at 5.9.  PLAN:  Patient discharged home in stable condition.  DISCHARGE MEDICATIONS: 1. (New) Plavix 75 mg one p.o. q.d. for one month, take with food. 2. Enteric coated aspirin  325 mg one q.d. 3. Nitroglycerin tablets 0.4 mg one sublingual p.r.n. chest pain up to    three tablets in 15 minutes. 4. (New) Zocor 10 mg p.o. q.h.s.  ACTIVITY:  May return to work 09/12/01.  Plans to enroll in outpatient cardiac rehabilitation.  DIET:  Low fat, low cholesterol.  WOUND CARE:  May shower.  SPECIAL INSTRUCTIONS: 1. Patient to call clinic if he develops large amount of swelling or bruising    in groin area. 2. Smoking cessation using tapering method. 3. Patient will need homocysteine level prior to return office visit with    Dr. Fraser Din to decide if his children need to be checked.  FOLLOW UP: 1. Follow up with Dr. Meade Maw Wednesday 09/14/01 at 9:30 a.m. 2. Our office will call the patient for follow up fasting statin profile    in approximately six weeks.  HISTORY OF PRESENT ILLNESS:  The patient is a very pleasant 50 year old gentleman with positive family history of coronary artery disease, personal history of heavy tobacco use and borderline hypertension.  The patient awoke the morning of admission about 6 oclock developing left lower substernal chest pressure which was constant.  There was a sharp component to pain. Discomfort radiated to his left shoulder which with tingling in his left arm. This was associated with shortness of breath,  diaphoresis and nausea.  His wife noted he looked pale.  He was reported to the [facility] emergency department where electrocardiogram revealed ST elevation with sloping ST segment approximately 1 mm V2, J point elevation with upsloping ST segment V3 and V4.  He was treated with intravenous heparin, bolus drip, sublingual nitrates, 150 mg Plavix, four baby aspirin and was taken for coronary angiography by Dr. Verdis Prime with results as above.  Post angioplasty his electrocardiogram changes normalized.  HOSPITAL COURSE:  Hospital course was uneventful and is detailed above.  LABORATORY DATA:  CBC:  On  admission white blood cell count 7.8, hemoglobin 16, hematocrit 45.5, platelet count 298K.  Admission coags within normal range.  ELECTROLYTES:  Sodium 137, potassium 3.5, chloride 105, cO2 24, glucose 146; 95 at time of discharge.  BUN 6, creatinine 0.9.  LIVER FUNCTION TESTS:  All within normal range.  CARDIAC ENZYMES:  Serial cardiac enzymes first set CK 133, MB fraction 1.7, troponin-I 0.03.  Second set CK 109, MB fraction 3.4, third CK 159, MB fraction 5.5, fourth CK 118, MB fraction 4.4.  LIPIDS:  Cholesterol 211, triglycerides 331, HDL 42, LDL 103.  CHEST X-RAY:  Negative for congestive heart failure, borderline cardiac enlargement.  HEMOGLOBIN A1C:  Within normal range at 5.9.  ELECTROCARDIOGRAM:  Admission electrocardiogram revealed normal sinus rhythm at 88 beats per minute with ST elevation 1 mm V2 with upsloping ST segment which is changed from his baseline of January 2001.  There is J point elevation with upsloping ST segments V3 through V4. Dictated by:   Anselm Lis, N.P. Attending Physician:  Mora Appl DD:  09/01/01 TD:  09/02/01 Job: [id number]

## 2010-10-17 NOTE — Op Note (Signed)
[place]. [facility]  Patient:    Thomas Dudley, Thomas Dudley Visit Number: [id number] MRN: [id number]          Service Type: Attending:  Margit Banda. Jearld Fenton, M.D. Dictated by:   Margit Banda. Jearld Fenton, M.D. Proc. Date: 02/24/01                             Operative Report  PREOPERATIVE DIAGNOSIS:  Obstructive sleep apnea, deviated septum, turbinate hypertrophy.  POSTOPERATIVE DIAGNOSIS:  Obstructive sleep apnea, deviated septum, turbinate hypertrophy.  OPERATION PERFORMED:  Tonsillectomy, uvulectomy, uvulopalatopharyngoplasty, septoplasty and submucous resection of inferior turbinates.  SURGEON:  Margit Banda. Jearld Fenton, M.D.  ANESTHESIA:  General endotracheal.  ESTIMATED BLOOD LOSS:  Approximately 10 cc.  INDICATIONS FOR PROCEDURE:  The patient is a 50 year old who has had significant problems with sleep and snoring.  He has had sleep apnea that was documented by a sleep study.  He has chronic nasal obstruction and congestion.  He has failed medical therapy with nasal steroid sprays.  He does have anatomy that is anemable to surgical repair.  He was informed of the risks and benefits of the procedure including bleeding, infection, perforation, change in the external appearance of his nose, numbness of his teeth, velopharyngeal insufficiency, change in the voice, chronic pain, and risks of the anesthetic.  All questions were answered and consent was obtained.  DESCRIPTION OF PROCEDURE:  The patient was taken to the operating room and placed in supine position.  After adequate general endotracheal tube anesthesia, he was placed in the supine position, prepped and draped in the usual sterile manner.  The oxymetazoline pledgets were placed into the nose bilaterally and the septum and inferior turbinates were injected with 1% lidocaine with 1:100,000 epinephrine.  A right hemitransfixion incision was performed raising the mucoperichondrial and ostial flap.  The cartilage was divided  about 1.5 cm posterior to the caudal strut.  The cartilage was then removed after elevating the opposite flap.  This was done with the freer elevator.  The inferior spur was removed with a 4 mm osteotome.  The posterior deviated bone and cartilage was removed with a Jansen-Middleton forceps.  This corrected the septal deflection.  The turbinates were infractured. Midline incision made with a 15 blade, the mucosal flap elevated superiorly.  The inferior mucosa and bone were removed with turbinate scissors.  The edge was cauterized with suction cautery.  The turbinates were then outfractured.  The hemitransfixion incision was closed with interrupted 4-0 chromic.  A piece of the cartilage was interposed between the flaps and secured with a 4-0 quilting stitch.  The remaining septum was quilted as well.  The packed rolled Telfa soaked in bacitracin were placed into the nose bilaterally and secured with a 3-0 nylon.  The table was then turned.  The patient was then placed in a Rose position.  The Crowe-Davis mouth gag was inserted and retracted, suspended from the Mayo stand.  The incision was made just above the uvula and then carried into the left tonsillar fossa.  The capsule of the tonsil was identified and the tonsil was removed with electrocautery dissection.  The right tonsil was removed in the same fashion.  The dissection was carried across the palate, removing the tonsil, palate uvula and opposite tonsil all in one en bloc piece.  The tonsillar fossas were suctioned with the cautery to obtain hemostasis.  The palate and tonsillar fossas were closed with interrupted 3-0 Vicryl.  The hypopharynx, esophagus and stomach were suctioned with the nasogastric tube.  The Crowe-Davis mouth gag was removed.  The patient was irrigated with saline and removed with suction.  The patient was released of the Crowe-Davis and then resuspended.  There was hemostasis present in all locations.  The patient  was awakened and brought to recovery room in stable condition.  Counts correct. Dictated by:   Margit Banda. Jearld Fenton, M.D. Attending:  Margit Banda. Jearld Fenton, M.D. DD:  02/24/01 TD:  02/24/01 Job: [id number]

## 2010-10-17 NOTE — H&P (Signed)
[place]  Patient:    Thomas Dudley, Thomas Dudley Visit Number: [id number] MRN: [id number]          Service Type: MED Location: [facility] 2928 [id number] Attending Physician:  Meade Maw A Dictated by:   Anselm Lis, N.P. Admit Date:  08/30/2001 Discharge Date: 09/01/2001   CC:         Jethro Bastos, M.D.   History and Physical  DATE OF BIRTH:  May 27, 1961.  IMPRESSION (AS DICTATED BY Darci Needle, M.D.): 1. Symptoms of unstable angina pectoris in this 50 year old gentleman with    positive family history of coronary artery disease, personal history of    ongoing tobacco abuse.  He does not have a history of diabetes mellitus    and his glucose elevated in the ER today at 142.  His mom is diabetic.    The patient has unknown cholesterol profile. He does have borderline    hypertension. Prior cardiac work-up for similar, though not as intense,    discomfort in the past, January 2001, included a normal echocardiogram    and a normal Cardiolite.  Discomfort in the past is not as intense as    that today.  He continued his chest discomfort despite IV nitrates,    heparin and aspirin.  His EKG reveals new ST elevation but with    up-sloping ST segment in V2 as compared to his baseline.  His first CK    is negative. 2. Tobacco abuse. 3. Hypertension. 4. Hyperglycemia with blood glucose 142 in the ER today.  PLAN (AS DICTATED BY DR. [facility]): 1. The patient counseled to undergo and accepted plans for coronary    angiography with possible percutaneous intervention if indicated and    able.   Risks, potential complications, benefits, and alternative to the    procedure were discussed in detail.  Mr. Vandevoort indicates his questions    and concerns have been addressed and is agreeable to proceed. 2. Will check fasting cholesterol profile. 3. Check hemoglobin A1C. 4. Tobacco cessation counseling.  HISTORY OF PRESENT ILLNESS:  Thomas Dudley is  a very pleasant, 50 year old gentleman with positive family history for CAD, personal history of heavy tobacco use, and borderline hypertension.  He awoke this morning about 6 oclock, developed left of lower substernal chest pressure which was constant in nature.  Some concurrent sharp discomfort.  Pain radiated to his left shoulder with tingling of his left arm.  He was short of breath, diaphoretic and nauseous.  His wife noted he looked pale.  He presented to [facility] where EKG revealed ST elevation with up-sloping ST segments approximately 1 mm  V2.  J-point elevation with up-sloping ST segment V3 and V4.  Currently his pain is rated a 3 to 4/10 (from earlier 6 to 7/10).  After IV heparin, 5000 k, sublingual nitrate x2, Plavix 150 and four baby aspirin.  He does have a slight pleuritic component chest pain.  The patient is status post a work-up with Meade Maw, M.D., January of 2001, for similar, though not as intense, chest discomfort in the past which included echocardiogram which was normal, and myocardial perfusion scan which was normal.  PREVIOUS MEDICAL HISTORY: 1. _______. 2. Tobacco abuse.  PAST SURGICAL HISTORY:  No surgery, excision of his adenoids, left thigh surgery, complications related to reaction to topical Neosporin.  ALLERGIES:  NEOSPORIN causes a severe topical reaction.  He is okay  with seafood, shellfish, and iodine products.  MEDICATIONS:  None.  SOCIAL HISTORY AND HABITS:  The patient has been married for four years.  He has three biological children and two stepchildren, all alive and well.  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  ETOH:  Rare occasional beer. Tobacco:  He smokes two packs per day for approximately 20 years.  FAMILY HISTORY:  His father deceased of lung cancer.  He was a heavy smoker. Mother is  alive but had a history of CABG.  She is diabetic.  Siblings are okay.  REVIEW OF SYSTEMS:  As per HPI/previous medical history.  In the past, his chest discomfort was not exertional related when he underwent evaluation with Dr. Fraser Din.  PHYSICAL EXAMINATION (AS PERFORMED BY DR. Verdis Prime):  GENERAL APPEARANCE:  He is a well-nourished 50 year old gentleman who appears anxious.  His wife is in attendance.  VITAL SIGNS:  Blood pressure 151/88, heart rate 90 and regular, respiratory rate 20, temperature 98.0, room air sat 99%.  NECK:  Brisk bilateral carotid upstroke without bruit.  Neck is without JVD, no thyromegaly.  CHEST:  Lung sounds clear with equal bilateral excursion. Negative CPA tenderness.  CARDIOVASCULAR:  Regular rate and rhythm without murmurs, rubs, or gallops. Normal S1 and S2.  ABDOMEN:  Soft, nondistended, normal active bowel sounds.  Negative abdominal aortic, renal, nor femoral bruit.  Nontender to applied pressure.  No masses, no organomegaly appreciated.  EXTREMITIES:  Distal pulses intact.  Negative pedal edema.  GENITAL AND RECTAL:  Examinations deferred.  NEUROLOGICAL:  Alert and oriented x3, grossly nonfocal.  LABORATORY TESTS AND DATA:  Protime 13, INR 1, PTT 37.  ISTAT-8 revealed sodium 138, potassium 3.7, chloride 107, CO2 24, BUN 6, creatinine 0.9, glucose of 146.  LFTs all within normal range.  EKG revealed NSR at 88 beats per minute, ST elevation 1 mm of V2 with up-sloping ST segment which is changed from his baseline January 2001.  He continues with J-point elevation with up-sloping ST segments V3 through V4. Dictated by:   Anselm Lis, N.P. Attending Physician:  Mora Appl DD:  08/30/01 TD:  08/30/01 Job: [id number]

## 2010-10-17 NOTE — H&P (Signed)
NAME:  Thomas Dudley, Thomas Dudley NO.:  [id number]   MEDICAL RECORD NO.:  [id number]                   PATIENT TYPE:  INP   LOCATION:  1824                                 FACILITY:  [facility]   PHYSICIAN:  Quita Skye. Waldon Reining, MD             DATE OF BIRTH:  09-07-1960   DATE OF ADMISSION:  04/04/2003  DATE OF DISCHARGE:                                HISTORY & PHYSICAL   REASON FOR CONSULTATION:  Thomas Dudley is a 50 year old man who was  transferred from [facility] [place] to [facility] for a possible acute myocardial infarction.  He received  thrombolytic therapy at [facility].   BRIEF HISTORY:  The patient has a history of coronary artery disease which  dates back to April of 2003.  At that time he suffered a mild lateral  myocardial infarction.  He subsequently underwent stenting of the first  obtuse marginal.  He has been back to [facility] two additional times for  chest pain.  His last cardiac catheterization was in January of 2004.  This  revealed normal wall motion, a normal left main, a 20 to 30% lesion distal  to the stent in the first obtuse marginal, a 30 to 50% diagonal lesion, and  a 30 to 50% posterior descending artery lesion.  Continued medical therapy  was recommended, including cessation of his heavy smoking.   The patient experienced the onset of chest pain early this evening while he  was attending a funeral.  This was described as a pressure discomfort in the  left anterior chest.  It radiated to the left shoulder.  It was associated  with dyspnea, diaphoresis, and nausea.  There were no exacerbating or  ameliorating factors.  It appeared to be unrelated to position, activity,  meals, or respirations.  He presented to the emergency department at  [facility] where his electrocardiogram was felt to reflect  an acute anterior myocardial infarction.  He received Lasix, Morphine,  intravenous nitroglycerin, Metoprolol, aspirin, Lovenox, and TNK.  He was  then transported to [facility].  The patient is free of chest pain  at this time.  The patient feels that this chest pain was similar to that  which heralded his prior myocardial infarction. There was no history of  congestive heart failure or arrhythmia.   In addition to the problems noted above, the patient has a history of  hypertension and hyperlipidemia.  Most of these problems are being treated.   ALLERGIES:  The patient is not allergic to any medications.   CURRENT MEDICATIONS:  1. Aspirin 325 mg p.o. daily.  2. Cardizem CD 180 mg p.o. daily.  3. Hydrochlorothiazide 25 mg p.o. daily.  4. Lisinopril 20 mg p.o. daily.  5. Plavix 75 mg p.o. daily.  6. Wellbutrin 150 mg p.o. b.i.d.  7. Zocor 20 mg p.o. daily.  8. Toprol  XL 25 mg p.o. daily.   SOCIAL HISTORY:  The patient is employed in a [employer].  He lives with  his wife.  He has five children.   FAMILY HISTORY:  His father died of lung cancer.  His mother, as well as  four siblings, are alive and suffer from diabetes mellitus.   SIGNIFICANT INJURIES:  None.   PREVIOUS OPERATIONS:  None.   REVIEW OF SYSTEMS:  Reveals no new problems related to his head, eyes, ears,  nose, mouth, throat, lungs, gastrointestinal system, genitourinary system,  or extremities.  There is no history of neurologic or psychiatric disorder.  There is no history of fever, chills, or weight loss.   PHYSICAL EXAMINATION:  VITAL SIGNS:  Blood pressure 121/74.  Pulse 76 and  regular.  Respirations 16.  Temperature 97.3.  GENERAL:  The patient was a middle-aged white man in no discomfort.  He was  alert and responsive.  HEENT:  Head, eyes, nose, and mouth were normal.  NECK:  Without thyromegaly or adenopathy.  Carotid pulses were palpable  bilaterally and without bruits.  CARDIAC EXAM:  Reveals a normal S1 and S2.  There was no S3, S4, murmur,  rub, or click.   Cardiac rhythm was regular.  No chest wall tenderness was  noted.  LUNGS:  Clear.  ABDOMEN:  Soft and nontender.  There was no mass, hepatosplenomegaly, bruit,  distention, rebound, guarding, or rigidity.  Bowel sounds were normal.  RECTAL/GENITAL EXAMINATIONS:  Were not performed as they were not pertinent  to the reason for acute care hospitalization.  EXTREMITIES:  Without edema, deviation, or deformity. Radial and dorsalis  pedis pulses were palpable bilaterally.  BRIEF SCREENING NEUROLOGIC SURVEY:  Was unremarkable.   LABORATORY AND ACCESSORY DATA:  The electrocardiogram performed here at  [facility] revealed mild ST segment depression and T wave inversion  in lead III; it was otherwise normal.  A total of six electrocardiograms  were performed at [facility].  In general these demonstrated J point  elevation and ST segment elevation in Leads V2 and V3.  This was felt to  possibly reflect a normal variant.  A chest radiograph was pending at the  time of this dictation.  Blood work was pending at the time of this  dictation.   IMPRESSION:  1. Chest pain, rule out myocardial infarction.  The patient received     thrombolytic therapy at an outside hospital.  He has not experienced     further chest pain.  The electrocardiograms are not necessarily     diagnostic of acute infarction.  2. Coronary artery disease.  Status post mild lateral myocardial infarction     in April, 2003 resulting in a stent to the first obtuse marginal.  Last     cardiac catheterization in January, 2004 revealed normal wall motion,     normal left main, a 20 to 30% lesion in the first obtuse marginal distal     to the stent, a 30 to 50% diagonal lesion, and a 30 to 50% posterior     descending artery lesion.  3. Hypertension.  4. Hyperlipidemia.   PLAN:  1. Coronary care unit.  2. Serial cardiac enzymes.  3. Aspirin. 4. The patient received Lovenox in an outside hospital; will hold  Lovenox in     anticipation of cardiac catheterization this morning.  5. Intravenous nitroglycerin.  6. Continue Toprol XL.  7.     Continue Plavix.  8. Discontinue smoking.  9. Further management per Dr. Fraser Din.                                                Quita Skye. Waldon Reining, MD    MSC/MEDQ  D:  04/04/2003  T:  04/04/2003  Job:  [id number]

## 2011-03-03 LAB — DIFFERENTIAL
Basophils Absolute: 0
Basophils Relative: 0
Eosinophils Absolute: 0.1
Eosinophils Relative: 1
Lymphocytes Relative: 18
Lymphs Abs: 2.2
Monocytes Absolute: 0.7
Monocytes Relative: 6
Neutro Abs: 9.3 — ABNORMAL HIGH
Neutrophils Relative %: 76

## 2011-03-03 LAB — CBC
HCT: 44.7
Hemoglobin: 15.4
MCHC: 34.4
MCV: 100.1 — ABNORMAL HIGH
Platelets: 297
RBC: 4.47
RDW: 13.1
WBC: 12.3 — ABNORMAL HIGH

## 2011-03-03 LAB — BASIC METABOLIC PANEL
BUN: 9
CO2: 28
Calcium: 8.4
Chloride: 97
Creatinine, Ser: 0.81
GFR calc Af Amer: >60
GFR calc non Af Amer: >60
Glucose, Bld: 123 — ABNORMAL HIGH
Potassium: 3.1 — ABNORMAL LOW
Sodium: 135

## 2012-08-25 ENCOUNTER — Other Ambulatory Visit: Payer: Self-pay | Admitting: Family Medicine

## 2012-08-25 DIAGNOSIS — M549 Dorsalgia, unspecified: Secondary | ICD-10-CM

## 2012-08-30 ENCOUNTER — Other Ambulatory Visit: Payer: Self-pay

## 2012-09-10 DIAGNOSIS — I219 Acute myocardial infarction, unspecified: Secondary | ICD-10-CM

## 2012-09-10 HISTORY — DX: Acute myocardial infarction, unspecified: I21.9

## 2013-03-16 ENCOUNTER — Encounter: Payer: Self-pay | Admitting: *Deleted

## 2013-03-16 ENCOUNTER — Other Ambulatory Visit: Payer: Self-pay | Admitting: General Surgery

## 2013-03-16 ENCOUNTER — Telehealth: Payer: Self-pay | Admitting: *Deleted

## 2013-03-16 ENCOUNTER — Encounter: Payer: Self-pay | Admitting: Cardiology

## 2013-03-16 DIAGNOSIS — I10 Essential (primary) hypertension: Secondary | ICD-10-CM

## 2013-03-16 MED ORDER — METOPROLOL SUCCINATE ER 50 MG PO TB24: 50.0000 mg | ORAL_TABLET | Freq: Every day | ORAL | Status: DC

## 2013-03-16 NOTE — Telephone Encounter (Signed)
Refill complete 

## 2013-03-19 ENCOUNTER — Encounter: Payer: Self-pay | Admitting: Cardiology

## 2013-03-19 DIAGNOSIS — I252 Old myocardial infarction: Secondary | ICD-10-CM | POA: Insufficient documentation

## 2013-03-19 DIAGNOSIS — Z720 Tobacco use: Secondary | ICD-10-CM | POA: Insufficient documentation

## 2013-03-19 DIAGNOSIS — E785 Hyperlipidemia, unspecified: Secondary | ICD-10-CM | POA: Insufficient documentation

## 2013-03-19 DIAGNOSIS — I10 Essential (primary) hypertension: Secondary | ICD-10-CM | POA: Insufficient documentation

## 2013-03-19 DIAGNOSIS — I2510 Atherosclerotic heart disease of native coronary artery without angina pectoris: Secondary | ICD-10-CM | POA: Insufficient documentation

## 2013-03-20 ENCOUNTER — Encounter: Payer: Self-pay | Admitting: Cardiology

## 2013-03-20 ENCOUNTER — Ambulatory Visit (INDEPENDENT_AMBULATORY_CARE_PROVIDER_SITE_OTHER): Payer: BC Managed Care – PPO | Admitting: Cardiology

## 2013-03-20 VITALS — BP 160/100 | HR 88 | Ht 71.0 in | Wt 218.0 lb

## 2013-03-20 DIAGNOSIS — I10 Essential (primary) hypertension: Secondary | ICD-10-CM

## 2013-03-20 DIAGNOSIS — G4733 Obstructive sleep apnea (adult) (pediatric): Secondary | ICD-10-CM | POA: Insufficient documentation

## 2013-03-20 DIAGNOSIS — E785 Hyperlipidemia, unspecified: Secondary | ICD-10-CM

## 2013-03-20 DIAGNOSIS — I2510 Atherosclerotic heart disease of native coronary artery without angina pectoris: Secondary | ICD-10-CM

## 2013-03-20 DIAGNOSIS — Z720 Tobacco use: Secondary | ICD-10-CM

## 2013-03-20 DIAGNOSIS — I252 Old myocardial infarction: Secondary | ICD-10-CM

## 2013-03-20 DIAGNOSIS — F17200 Nicotine dependence, unspecified, uncomplicated: Secondary | ICD-10-CM

## 2013-03-20 MED ORDER — METOPROLOL SUCCINATE ER 50 MG PO TB24: ORAL_TABLET | ORAL | Status: DC

## 2013-03-20 NOTE — Patient Instructions (Signed)
Increased Metoprolol to 1 1/2 tablets daily.  Please schedule to have Lipid and Hepatic panel done within the next week while fasting  Your physician recommends that you schedule a follow-up appointment in: 2 weeks as a nurse visit for a BP check   Your physician wants you to follow-up in: 6 months with Dr. Mayford Knife

## 2013-03-20 NOTE — Progress Notes (Signed)
[address] [place], [place]  [address] Phone: [phone] Fax:  [phone]  Date:  03/20/2013   ID:  Thomas Dudley, DOB 1960/11/29, MRN [id number]  PCP:  No primary provider on file.  Cardiologist:  Armanda Magic, MD     History of Present Illness: Thomas Dudley is a 52 y.o. male with a history of HTN, OSA, dyslipidemia and CAD who presents for followup.  He is doing well.  He had a STEMI in April of this year in [place] and underwent PCI of the PL.  He has been doing well.  He denies any chest pain, SOE, DOE, LE edema, dizziness, palpitations or syncope.  He occasionally uses his CPAP but is not very compliant with it.  He says it is annoying.  He has cut significantly back on his tobacco and has only smoked 2 cigarettes in the past 2 months.   Wt Readings from Last 3 Encounters:  03/20/13 218 lb (98.884 kg)     Past Medical History  Diagnosis Date  . Hypercholesteremia   . HTN (hypertension)   . PVD (peripheral vascular disease)   . Allergic rhinitis   . Diverticulitis   . Hemorrhage of rectum and anus   . Back pain   . Insomnia   . GERD (gastroesophageal reflux disease)   . Dyslipidemia   . OSA (obstructive sleep apnea)   . Gastritis   . Tobacco abuse   . Coronary atherosclerosis of native coronary artery     post PCI of obtuse marginal in 2004 with no nonobstructive disease in other territories, cath 04/2008 PCI of OM2, PDA and distal RCA, s/p STEMI with PCI of right PL and normal LVF 08/2012 in [facility] [place]    Current Outpatient Prescriptions  Medication Sig Dispense Refill  . aspirin 325 MG tablet Take 325 mg by mouth daily.      Marland Kitchen atorvastatin (LIPITOR) 80 MG tablet Take 80 mg by mouth daily.      Marland Kitchen buPROPion (WELLBUTRIN XL) 300 MG 24 hr tablet Take 300 mg by mouth daily.      . clopidogrel (PLAVIX) 75 MG tablet Take 75 mg by mouth daily.      Marland Kitchen HYDROcodone-acetaminophen (NORCO/VICODIN) 5-325 MG per tablet Take 1 tablet by mouth every 6 (six) hours  as needed for pain.      Marland Kitchen lisinopril (PRINIVIL,ZESTRIL) 20 MG tablet Take 20 mg by mouth daily.      . metoprolol succinate (TOPROL-XL) 50 MG 24 hr tablet Take 1 tablet (50 mg total) by mouth daily. Take with or immediately following a meal.  90 tablet  3  . nitroGLYCERIN (NITROSTAT) 0.4 MG SL tablet Place 0.4 mg under the tongue every 5 (five) minutes as needed for chest pain.      . pantoprazole (PROTONIX) 40 MG tablet Take 40 mg by mouth daily.      Marland Kitchen zolpidem (AMBIEN) 5 MG tablet Take 5 mg by mouth at bedtime as needed for sleep.       No current facility-administered medications for this visit.    Allergies:    Allergies  Allergen Reactions  . Neosporin [Neomycin-Bacitracin Zn-Polymyx]     Social History:  The patient  reports that he has quit smoking. He does not have any smokeless tobacco history on file. He reports that he drinks about 1.2 ounces of alcohol per week. He reports that he does not use illicit drugs.   Family History:  The patient's  family history includes Breast cancer in his mother; Heart disease in his mother; Lung cancer in his father.   ROS:  Please see the history of present illness.      All other systems reviewed and negative.   PHYSICAL EXAM: VS:  BP 160/100  Pulse 88  Ht 5\' 11"  (1.803 m)  Wt 218 lb (98.884 kg)  BMI 30.42 kg/m2  SpO2 98% Well nourished, well developed, in no acute distress HEENT: normal Neck: no JVD Cardiac:  normal S1, S2; RRR; no murmur Lungs:  clear to auscultation bilaterally, no wheezing, rhonchi or rales Abd: soft, nontender, no hepatomegaly Ext: no edema Skin: warm and dry Neuro:  CNs 2-12 intact, no focal abnormalities noted       ASSESSMENT AND PLAN:  1. ASCAD s/p remote STEMI with no further angina  - continue ASA/Plavix 2. HTN - poorly controlled this am  - Increase Toprol to 50mg  1 and 1/2 tablet daily  - continue Lisinopril 3. dyslipdiemia   - continue statin 4. OSA - not compliant with his CPAP  - I have  encouarged him to use his CPAP nightly 5. Obesity - he has been walking for exercise 6. Tobacco abuse - he has basically quit and I congratulated him!  Followup BP check in 2 weeks Followup with me in 6 months  Followup with me in 6 months  Signed, Armanda Magic, MD 03/20/2013 3:08 PM

## 2013-03-27 ENCOUNTER — Other Ambulatory Visit: Payer: BC Managed Care – PPO

## 2013-04-03 ENCOUNTER — Ambulatory Visit: Payer: Self-pay | Admitting: Cardiology

## 2013-05-02 ENCOUNTER — Other Ambulatory Visit (INDEPENDENT_AMBULATORY_CARE_PROVIDER_SITE_OTHER): Payer: BC Managed Care – PPO

## 2013-05-02 ENCOUNTER — Ambulatory Visit (INDEPENDENT_AMBULATORY_CARE_PROVIDER_SITE_OTHER): Payer: BC Managed Care – PPO | Admitting: *Deleted

## 2013-05-02 VITALS — BP 132/80 | HR 80 | Resp 20

## 2013-05-02 DIAGNOSIS — I2510 Atherosclerotic heart disease of native coronary artery without angina pectoris: Secondary | ICD-10-CM

## 2013-05-02 DIAGNOSIS — I10 Essential (primary) hypertension: Secondary | ICD-10-CM

## 2013-05-02 LAB — LIPID PANEL
Cholesterol: 134 mg/dL (ref 0–200)
HDL: 44 mg/dL (ref >39.00)
LDL Cholesterol: 66 mg/dL (ref 0–99)
Total CHOL/HDL Ratio: 3
Triglycerides: 121 mg/dL (ref 0.0–149.0)
VLDL: 24.2 mg/dL (ref 0.0–40.0)

## 2013-05-02 LAB — HEPATIC FUNCTION PANEL
ALT: 40 U/L (ref 0–53)
AST: 24 U/L (ref 0–37)
Albumin: 4.1 g/dL (ref 3.5–5.2)
Alkaline Phosphatase: 54 U/L (ref 39–117)
Bilirubin, Direct: 0.1 mg/dL (ref 0.0–0.3)
Total Bilirubin: 0.9 mg/dL (ref 0.3–1.2)
Total Protein: 7.2 g/dL (ref 6.0–8.3)

## 2013-05-02 NOTE — Progress Notes (Signed)
Pt here for bp check and bloodwork. Feels well today without complaints. Increased metoprolol 50mg  to 1.5 tabs (75mg ) daily Advised will receive phone call after lab results are reviewed.

## 2013-05-04 ENCOUNTER — Encounter: Payer: Self-pay | Admitting: General Surgery

## 2013-05-04 ENCOUNTER — Other Ambulatory Visit: Payer: Self-pay | Admitting: General Surgery

## 2013-05-04 DIAGNOSIS — E785 Hyperlipidemia, unspecified: Secondary | ICD-10-CM

## 2013-05-04 DIAGNOSIS — Z79899 Other long term (current) drug therapy: Secondary | ICD-10-CM

## 2013-05-12 ENCOUNTER — Other Ambulatory Visit: Payer: Self-pay | Admitting: Cardiology

## 2013-06-10 ENCOUNTER — Other Ambulatory Visit: Payer: Self-pay | Admitting: Cardiology

## 2013-07-21 ENCOUNTER — Other Ambulatory Visit: Payer: Self-pay | Admitting: Rheumatology

## 2013-07-21 DIAGNOSIS — M25562 Pain in left knee: Secondary | ICD-10-CM

## 2013-07-25 ENCOUNTER — Ambulatory Visit
Admission: RE | Admit: 2013-07-25 | Discharge: 2013-07-25 | Disposition: A | Discharge status: Home / Self Care | Payer: BC Managed Care – PPO | Source: Ambulatory Visit | Attending: Rheumatology | Admitting: Rheumatology

## 2013-07-25 DIAGNOSIS — M25562 Pain in left knee: Secondary | ICD-10-CM

## 2013-08-29 ENCOUNTER — Telehealth: Payer: Self-pay | Admitting: Cardiology

## 2013-08-29 NOTE — Telephone Encounter (Signed)
I will forward to dr/cma. 

## 2013-08-29 NOTE — Telephone Encounter (Signed)
New message    Having knee surgery in April.  Pt need clearance for Dr Ranell PatrickNorris to do surgery. Please fax clearance note to [facility] and let pt know it has been faxed

## 2013-08-29 NOTE — Telephone Encounter (Signed)
Patient was asymptomatic from a cardiac standpoint at [facility] in December 2014.  Please find out if he has had any angina or SOB since I saw him last and he has not had any symptoms he is cleared from a cardiac standpoint for surgery.  He had a STEMI 1 year ago so ok to come off plavix for surgery at end of April

## 2013-08-29 NOTE — Telephone Encounter (Signed)
Pt has not had any Sob/ CP. Pt is cleared from a cardiac stand point for knee surgery.  Per Dr Malachy Moodurners recommendations as stated below, pt is to remain on plavix till the end of April. Pt was informed and I will route note to Dr Ranell PatrickNorris.

## 2013-08-30 ENCOUNTER — Telehealth: Payer: Self-pay | Admitting: Cardiology

## 2013-08-30 NOTE — Telephone Encounter (Signed)
New message     Faxed clearance note yesterday to be cleared for left knee scope.  If we did not get it, pls call and they will fax another form.  Cannot schedule surgery until they get form.

## 2013-08-30 NOTE — Telephone Encounter (Signed)
Phone note from 3/31 giving clearance was faxed to Dr Ranell PatrickNorris

## 2013-09-06 ENCOUNTER — Telehealth: Payer: Self-pay | Admitting: Cardiology

## 2013-09-06 NOTE — Telephone Encounter (Signed)
Received request from Nurse fax box, documents faxed for surgical clearance. To: [facility] Fax number: [phone] Attention: 4.8.15/kdm

## 2013-09-06 NOTE — Telephone Encounter (Signed)
New message     Need knee surgery but [facility] (Dr Ranell PatrickNorris) has not received the clearance form.  They faxed it over a week ago.  Did we get the form?  Please call pt and let him know.

## 2013-09-06 NOTE — Telephone Encounter (Signed)
[place] and told her we have already faxed but stated we would send another. Confirmed Fax number and resent clearance for pt.

## 2013-09-07 ENCOUNTER — Encounter (HOSPITAL_COMMUNITY): Payer: Self-pay | Admitting: Pharmacy Technician

## 2013-09-13 NOTE — H&P (Signed)
Thomas Dudley is an 53 y.o. male.    Chief Complaint: left knee arthroscopy  HPI: Pt is a 53 y.o. male complaining of left knee pain for several weeks. Pain had continually increased since the beginning. X-rays in the clinic show meniscal tear left knee. Pt has tried various conservative treatments which have failed to alleviate their symptoms, including injections and therapy. Various options are discussed with the patient. Risks, benefits and expectations were discussed with the patient. Patient understand the risks, benefits and expectations and wishes to proceed with surgery.   PCP:  No primary provider on file.  D/C Plans:  Home   PMH: Past Medical History  Diagnosis Date  . Hypercholesteremia   . HTN (hypertension)   . PVD (peripheral vascular disease)   . Allergic rhinitis   . Diverticulitis   . Hemorrhage of rectum and anus   . Back pain   . Insomnia   . GERD (gastroesophageal reflux disease)   . Dyslipidemia   . OSA (obstructive sleep apnea)   . Gastritis   . Tobacco abuse   . Coronary atherosclerosis of native coronary artery     post PCI of obtuse marginal in 2004 with no nonobstructive disease in other territories, cath 04/2008 PCI of OM2, PDA and distal RCA, s/p STEMI with PCI of right PL and normal LVF [place]    PSH: Past Surgical History  Procedure Laterality Date  . Cardiac catheterization    . Coronary angioplasty      PCI of OM 2004, PCI of PDA.OM2/distal RCA 2009, PCI of PL 08/2012    Social History:  reports that he has quit smoking. He does not have any smokeless tobacco history on file. He reports that he drinks about 1.2 ounces of alcohol per week. He reports that he does not use illicit drugs.  Allergies:  Allergies  Allergen Reactions  . Neosporin [Neomycin-Bacitracin Zn-Polymyx] Other (See Comments)    Blisters     Medications: No current facility-administered medications for this encounter.   Current Outpatient  Prescriptions  Medication Sig Dispense Refill  . aspirin 325 MG tablet Take 325 mg by mouth daily.      Marland Kitchen. atorvastatin (LIPITOR) 80 MG tablet Take 80 mg by mouth daily.      Marland Kitchen. buPROPion (WELLBUTRIN XL) 300 MG 24 hr tablet Take 300 mg by mouth daily.      . clopidogrel (PLAVIX) 75 MG tablet Take 75 mg by mouth daily with breakfast.      . HYDROcodone-acetaminophen (NORCO/VICODIN) 5-325 MG per tablet Take 1 tablet by mouth every 6 (six) hours as needed for pain.      Marland Kitchen. lisinopril (PRINIVIL,ZESTRIL) 20 MG tablet Take 20 mg by mouth daily.      . metoprolol succinate (TOPROL-XL) 50 MG 24 hr tablet Take 75 mg by mouth daily. Take with or immediately following a meal.      . nitroGLYCERIN (NITROSTAT) 0.4 MG SL tablet Place 0.4 mg under the tongue every 5 (five) minutes as needed for chest pain.      . pantoprazole (PROTONIX) 40 MG tablet Take 40 mg by mouth daily.      Marland Kitchen. zolpidem (AMBIEN) 5 MG tablet Take 5 mg by mouth at bedtime as needed for sleep.        No results found for this or any previous visit (from the past 48 hour(s)). No results found.  ROS: Pain with rom of the left lower extremity  Physical Exam:  Alert and oriented 53 y.o. male in no acute distress Cranial nerves 2-12 intact Cervical spine: full rom with no tenderness, nv intact distally Chest: active breath sounds bilaterally, no wheeze rhonchi or rales Heart: regular rate and rhythm, no murmur Abd: non tender non distended with active bowel sounds Hip is stable with rom  Mild to moderate tenderness left knee with rom Minimally antalgic gait nv intact distally No rashes or edema  Assessment/Plan Assessment: left knee pain secondary to meniscal tear  Plan: Patient will undergo a left knee arthroscopy by [facility]. Risks benefits and expectations were discussed with the patient. Patient understand risks, benefits and expectations and wishes to proceed.

## 2013-09-14 ENCOUNTER — Ambulatory Visit (HOSPITAL_COMMUNITY)
Admission: RE | Admit: 2013-09-14 | Discharge: 2013-09-14 | Disposition: A | Discharge status: Home / Self Care | Payer: BC Managed Care – PPO | Source: Ambulatory Visit | Attending: Anesthesiology | Admitting: Anesthesiology

## 2013-09-14 ENCOUNTER — Encounter (HOSPITAL_COMMUNITY): Payer: Self-pay

## 2013-09-14 ENCOUNTER — Encounter (HOSPITAL_COMMUNITY)
Admission: RE | Admit: 2013-09-14 | Discharge: 2013-09-14 | Disposition: A | Discharge status: Home / Self Care | Payer: BC Managed Care – PPO | Source: Ambulatory Visit | Attending: Orthopedic Surgery | Admitting: Orthopedic Surgery

## 2013-09-14 DIAGNOSIS — Z01812 Encounter for preprocedural laboratory examination: Secondary | ICD-10-CM | POA: Insufficient documentation

## 2013-09-14 HISTORY — DX: Unspecified osteoarthritis, unspecified site: M19.90

## 2013-09-14 HISTORY — DX: Pain in unspecified joint: M25.50

## 2013-09-14 HISTORY — DX: Personal history of colonic polyps: Z86.010

## 2013-09-14 HISTORY — DX: Major depressive disorder, single episode, unspecified: F32.9

## 2013-09-14 HISTORY — DX: Depression, unspecified: F32.A

## 2013-09-14 HISTORY — DX: Pneumonia, unspecified organism: J18.9

## 2013-09-14 HISTORY — DX: Acute myocardial infarction, unspecified: I21.9

## 2013-09-14 HISTORY — DX: Personal history of colon polyps, unspecified: Z86.0100

## 2013-09-14 LAB — BASIC METABOLIC PANEL
BUN: 8 mg/dL (ref 6–23)
CO2: 26 mEq/L (ref 19–32)
Calcium: 8.9 mg/dL (ref 8.4–10.5)
Chloride: 100 mEq/L (ref 96–112)
Creatinine, Ser: 0.82 mg/dL (ref 0.50–1.35)
GFR calc Af Amer: >90 mL/min (ref >90)
GFR calc non Af Amer: >90 mL/min (ref >90)
Glucose, Bld: 74 mg/dL (ref 70–99)
Potassium: 3.9 mEq/L (ref 3.7–5.3)
Sodium: 140 mEq/L (ref 137–147)

## 2013-09-14 LAB — CBC
HCT: 44.1 % (ref 39.0–52.0)
Hemoglobin: 15.4 g/dL (ref 13.0–17.0)
MCH: 34.5 pg — ABNORMAL HIGH (ref 26.0–34.0)
MCHC: 34.9 g/dL (ref 30.0–36.0)
MCV: 98.9 fL (ref 78.0–100.0)
Platelets: 313 10*3/uL (ref 150–400)
RBC: 4.46 MIL/uL (ref 4.22–5.81)
RDW: 12.8 % (ref 11.5–15.5)
WBC: 12.1 10*3/uL — ABNORMAL HIGH (ref 4.0–10.5)

## 2013-09-14 MED ORDER — CHLORHEXIDINE GLUCONATE 4 % EX LIQD: 60.0000 mL | Freq: Once | CUTANEOUS | Status: DC
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
## 2013-09-14 NOTE — Progress Notes (Addendum)
Cardiologist is Dr.Traci Turner with clearance note in epic   Stress test in epic from 2012  Multiple heart cath reports in epic most recent one in 2009  Unsure when last EKG was done   Denies CXR in past yr  Medical Md is with [facility] on [address]

## 2013-09-14 NOTE — Progress Notes (Signed)
Requested heart cath and cardiac notes from [facility]

## 2013-09-14 NOTE — Pre-Procedure Instructions (Signed)
Thomas Dudley  09/14/2013   Your procedure is scheduled on:  Fri, April 24 @ 1:30 PM  [facility]  at 11:30 AM.  Call this number if you have problems the morning of surgery: [phone]   Remember:   Do not eat food or drink liquids after midnight.   Take these medicines the morning of surgery with A SIP OF WATER: Wellbutrin(Bupropion),Pain Pill(if needed),Metoprolol(Toprol),and Pantoprazole(Protonix)               Stop taking your Plavix as you've been instructed as well as Aspirin. No Goody's,BC's,Aleve,Ibuprofen,Fish Oil,or any Herbal Medications   Do not wear jewelry.  Do not wear lotions, powders, or colognes. You may wear deodorant.  Men may shave face and neck.  Do not bring valuables to the hospital.  [facility] is not responsible                  for any belongings or valuables.               Contacts, dentures or bridgework may not be worn into surgery.  Leave suitcase in the car. After surgery it may be brought to your room.  For patients admitted to the hospital, discharge time is determined by your                treatment team.               Patients discharged the day of surgery will not be allowed to drive  home.    Special Instructions:  [place] - Preparing for Surgery  Before surgery, you can play an important role.  Because skin is not sterile, your skin needs to be as free of germs as possible.  You can reduce the number of germs on you skin by washing with CHG (chlorahexidine gluconate) soap before surgery.  CHG is an antiseptic cleaner which kills germs and bonds with the skin to continue killing germs even after washing.  Please DO NOT use if you have an allergy to CHG or antibacterial soaps.  If your skin becomes reddened/irritated stop using the CHG and inform your nurse when you arrive at Short Stay.  Do not shave (including legs and underarms) for at least 48 hours prior to the first CHG shower.  You may shave your face.  Please  follow these instructions carefully:   1.  Shower with CHG Soap the night before surgery and the                                morning of Surgery.  2.  If you choose to wash your hair, wash your hair first as usual with your       normal shampoo.  3.  After you shampoo, rinse your hair and body thoroughly to remove the                      Shampoo.  4.  Use CHG as you would any other liquid soap.  You can apply chg directly       to the skin and wash gently with scrungie or a clean washcloth.  5.  Apply the CHG Soap to your body ONLY FROM THE NECK DOWN.        Do not use on open wounds or open sores.  Avoid contact with your eyes,  ears, mouth and genitals (private parts).  Wash genitals (private parts)       with your normal soap.  6.  Wash thoroughly, paying special attention to the area where your surgery        will be performed.  7.  Thoroughly rinse your body with warm water from the neck down.  8.  DO NOT shower/wash with your normal soap after using and rinsing off       the CHG Soap.  9.  Pat yourself dry with a clean towel.            10.  Wear clean pajamas.            11.  Place clean sheets on your bed the night of your first shower and do not        sleep with pets.  Day of Surgery  Do not apply any lotions/deoderants the morning of surgery.  Please wear clean clothes to the hospital/surgery center.     Please read over the following fact sheets that you were given: Pain Booklet, Coughing and Deep Breathing and Surgical Site Infection Prevention
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
## 2013-09-21 MED ORDER — CEFAZOLIN SODIUM-DEXTROSE 2-3 GM-% IV SOLR
2.0000 g | INTRAVENOUS | Status: AC
  Administered 2013-09-22: 2 g via INTRAVENOUS
  Filled 2013-09-21: qty 50

## 2013-09-22 ENCOUNTER — Ambulatory Visit (HOSPITAL_COMMUNITY): Payer: BC Managed Care – PPO | Admitting: Anesthesiology

## 2013-09-22 ENCOUNTER — Encounter (HOSPITAL_COMMUNITY)
Admission: RE | Disposition: A | Discharge status: Home / Self Care | Payer: Self-pay | Source: Ambulatory Visit | Attending: Orthopedic Surgery

## 2013-09-22 ENCOUNTER — Encounter (HOSPITAL_COMMUNITY): Payer: Self-pay | Admitting: *Deleted

## 2013-09-22 ENCOUNTER — Encounter (HOSPITAL_COMMUNITY): Payer: BC Managed Care – PPO | Admitting: Vascular Surgery

## 2013-09-22 ENCOUNTER — Ambulatory Visit (HOSPITAL_COMMUNITY)
Admission: RE | Admit: 2013-09-22 | Discharge: 2013-09-22 | Disposition: A | Discharge status: Home / Self Care | Payer: BC Managed Care – PPO | Source: Ambulatory Visit | Attending: Orthopedic Surgery | Admitting: Orthopedic Surgery

## 2013-09-22 DIAGNOSIS — M2240 Chondromalacia patellae, unspecified knee: Secondary | ICD-10-CM | POA: Insufficient documentation

## 2013-09-22 DIAGNOSIS — Z7902 Long term (current) use of antithrombotics/antiplatelets: Secondary | ICD-10-CM | POA: Insufficient documentation

## 2013-09-22 DIAGNOSIS — I252 Old myocardial infarction: Secondary | ICD-10-CM | POA: Insufficient documentation

## 2013-09-22 DIAGNOSIS — I739 Peripheral vascular disease, unspecified: Secondary | ICD-10-CM | POA: Insufficient documentation

## 2013-09-22 DIAGNOSIS — I10 Essential (primary) hypertension: Secondary | ICD-10-CM | POA: Insufficient documentation

## 2013-09-22 DIAGNOSIS — M6740 Ganglion, unspecified site: Secondary | ICD-10-CM | POA: Insufficient documentation

## 2013-09-22 DIAGNOSIS — K219 Gastro-esophageal reflux disease without esophagitis: Secondary | ICD-10-CM | POA: Insufficient documentation

## 2013-09-22 DIAGNOSIS — F3289 Other specified depressive episodes: Secondary | ICD-10-CM | POA: Insufficient documentation

## 2013-09-22 DIAGNOSIS — M23302 Other meniscus derangements, unspecified lateral meniscus, unspecified knee: Secondary | ICD-10-CM | POA: Insufficient documentation

## 2013-09-22 DIAGNOSIS — J309 Allergic rhinitis, unspecified: Secondary | ICD-10-CM | POA: Insufficient documentation

## 2013-09-22 DIAGNOSIS — F17200 Nicotine dependence, unspecified, uncomplicated: Secondary | ICD-10-CM | POA: Insufficient documentation

## 2013-09-22 DIAGNOSIS — E7800 Pure hypercholesterolemia, unspecified: Secondary | ICD-10-CM | POA: Insufficient documentation

## 2013-09-22 DIAGNOSIS — G4733 Obstructive sleep apnea (adult) (pediatric): Secondary | ICD-10-CM | POA: Insufficient documentation

## 2013-09-22 DIAGNOSIS — Z7982 Long term (current) use of aspirin: Secondary | ICD-10-CM | POA: Insufficient documentation

## 2013-09-22 DIAGNOSIS — Z9861 Coronary angioplasty status: Secondary | ICD-10-CM | POA: Insufficient documentation

## 2013-09-22 DIAGNOSIS — I2510 Atherosclerotic heart disease of native coronary artery without angina pectoris: Secondary | ICD-10-CM | POA: Insufficient documentation

## 2013-09-22 DIAGNOSIS — G4700 Insomnia, unspecified: Secondary | ICD-10-CM | POA: Insufficient documentation

## 2013-09-22 DIAGNOSIS — M549 Dorsalgia, unspecified: Secondary | ICD-10-CM | POA: Insufficient documentation

## 2013-09-22 DIAGNOSIS — F329 Major depressive disorder, single episode, unspecified: Secondary | ICD-10-CM | POA: Insufficient documentation

## 2013-09-22 DIAGNOSIS — M6750 Plica syndrome, unspecified knee: Secondary | ICD-10-CM | POA: Insufficient documentation

## 2013-09-22 HISTORY — PX: KNEE ARTHROSCOPY: SHX127

## 2013-09-22 SURGERY — ARTHROSCOPY, KNEE
Anesthesia: General | Site: Knee | Laterality: Left

## 2013-09-22 MED ORDER — SODIUM CHLORIDE 0.9 % IR SOLN
Status: DC | PRN
  Administered 2013-09-22 (×2): 3000 mL

## 2013-09-22 MED ORDER — OXYCODONE HCL 5 MG/5ML PO SOLN: 5.0000 mg | Freq: Once | ORAL | Status: AC | PRN

## 2013-09-22 MED ORDER — EPHEDRINE SULFATE 50 MG/ML IJ SOLN
INTRAMUSCULAR | Status: DC | PRN
  Administered 2013-09-22: 15 mg via INTRAVENOUS
  Administered 2013-09-22: 10 mg via INTRAVENOUS
  Administered 2013-09-22: 15 mg via INTRAVENOUS
  Administered 2013-09-22: 10 mg via INTRAVENOUS

## 2013-09-22 MED ORDER — FENTANYL CITRATE 0.05 MG/ML IJ SOLN
INTRAMUSCULAR | Status: AC
  Filled 2013-09-22: qty 5

## 2013-09-22 MED ORDER — BUPIVACAINE-EPINEPHRINE 0.5% -1:200000 IJ SOLN
INTRAMUSCULAR | Status: DC | PRN
  Administered 2013-09-22: 30 mL

## 2013-09-22 MED ORDER — OXYCODONE-ACETAMINOPHEN 7.5-325 MG PO TABS: 1.0000 | ORAL_TABLET | ORAL | Status: DC | PRN

## 2013-09-22 MED ORDER — OXYCODONE HCL 5 MG PO TABS
5.0000 mg | ORAL_TABLET | Freq: Once | ORAL | Status: AC | PRN
  Administered 2013-09-22: 5 mg via ORAL

## 2013-09-22 MED ORDER — MIDAZOLAM HCL 5 MG/5ML IJ SOLN
INTRAMUSCULAR | Status: DC | PRN
  Administered 2013-09-22: 2 mg via INTRAVENOUS

## 2013-09-22 MED ORDER — LACTATED RINGERS IV SOLN
INTRAVENOUS | Status: DC
  Administered 2013-09-22: 12:00:00 via INTRAVENOUS

## 2013-09-22 MED ORDER — PROPOFOL 10 MG/ML IV BOLUS
INTRAVENOUS | Status: AC
  Filled 2013-09-22: qty 20

## 2013-09-22 MED ORDER — OXYCODONE HCL 5 MG PO TABS
ORAL_TABLET | ORAL | Status: AC
  Filled 2013-09-22: qty 1

## 2013-09-22 MED ORDER — ONDANSETRON HCL 4 MG/2ML IJ SOLN
INTRAMUSCULAR | Status: AC
  Filled 2013-09-22: qty 2

## 2013-09-22 MED ORDER — FENTANYL CITRATE 0.05 MG/ML IJ SOLN
INTRAMUSCULAR | Status: DC | PRN
  Administered 2013-09-22: 100 ug via INTRAVENOUS

## 2013-09-22 MED ORDER — ONDANSETRON HCL 4 MG/2ML IJ SOLN: 4.0000 mg | Freq: Four times a day (QID) | INTRAMUSCULAR | Status: DC | PRN

## 2013-09-22 MED ORDER — KETOROLAC TROMETHAMINE 30 MG/ML IJ SOLN
INTRAMUSCULAR | Status: DC | PRN
  Administered 2013-09-22: 30 mg via INTRAVENOUS

## 2013-09-22 MED ORDER — PROPOFOL 10 MG/ML IV BOLUS
INTRAVENOUS | Status: DC | PRN
  Administered 2013-09-22: 180 mg via INTRAVENOUS

## 2013-09-22 MED ORDER — LIDOCAINE HCL (CARDIAC) 20 MG/ML IV SOLN
INTRAVENOUS | Status: DC | PRN
  Administered 2013-09-22: 70 mg via INTRAVENOUS

## 2013-09-22 MED ORDER — LIDOCAINE HCL (CARDIAC) 20 MG/ML IV SOLN
INTRAVENOUS | Status: AC
  Filled 2013-09-22: qty 5

## 2013-09-22 MED ORDER — HYDROMORPHONE HCL PF 1 MG/ML IJ SOLN
INTRAMUSCULAR | Status: AC
  Filled 2013-09-22: qty 1

## 2013-09-22 MED ORDER — ONDANSETRON HCL 4 MG/2ML IJ SOLN
INTRAMUSCULAR | Status: DC | PRN
  Administered 2013-09-22: 4 mg via INTRAVENOUS

## 2013-09-22 MED ORDER — BUPIVACAINE-EPINEPHRINE (PF) 0.5% -1:200000 IJ SOLN
INTRAMUSCULAR | Status: AC
  Filled 2013-09-22: qty 30

## 2013-09-22 MED ORDER — HYDROMORPHONE HCL PF 1 MG/ML IJ SOLN: 0.2500 mg | INTRAMUSCULAR | Status: DC | PRN

## 2013-09-22 MED ORDER — METHOCARBAMOL 500 MG PO TABS
500.0000 mg | ORAL_TABLET | Freq: Three times a day (TID) | ORAL | Status: DC | PRN
Start: 2013-09-22 — End: 2014-05-18

## 2013-09-22 MED ORDER — MIDAZOLAM HCL 2 MG/2ML IJ SOLN
INTRAMUSCULAR | Status: AC
Start: 2013-09-22 — End: 2013-09-22
  Filled 2013-09-22: qty 2

## 2013-09-22 SURGICAL SUPPLY — 33 items
BANDAGE GAUZE ELAST BULKY 4 IN (GAUZE/BANDAGES/DRESSINGS) ×4 IMPLANT
BLADE CUTTER GATOR 3.5 (BLADE) ×2 IMPLANT
BNDG COHESIVE 6X5 TAN STRL LF (GAUZE/BANDAGES/DRESSINGS) ×2 IMPLANT
BNDG ELASTIC 6X10 VLCR STRL LF (GAUZE/BANDAGES/DRESSINGS) ×2 IMPLANT
CLSR STERI-STRIP ANTIMIC 1/2X4 (GAUZE/BANDAGES/DRESSINGS) ×2 IMPLANT
DRAPE ARTHROSCOPY W/POUCH 114 (DRAPES) ×2 IMPLANT
DURAPREP 26ML APPLICATOR (WOUND CARE) ×2 IMPLANT
ELECT MENISCUS 165MM 90D (ELECTRODE) IMPLANT
ELECT REM PT RETURN 9FT ADLT (ELECTROSURGICAL) ×2
ELECTRODE REM PT RTRN 9FT ADLT (ELECTROSURGICAL) ×1 IMPLANT
GAUZE SPONGE 4X4 16PLY XRAY LF (GAUZE/BANDAGES/DRESSINGS) ×2 IMPLANT
GLOVE BIOGEL PI ORTHO PRO SZ8 (GLOVE) ×1
GLOVE PI ORTHO PRO STRL SZ8 (GLOVE) ×1 IMPLANT
GLOVE SURG ORTHO 8.5 STRL (GLOVE) ×2 IMPLANT
GOWN STRL REUS W/ TWL XL LVL3 (GOWN DISPOSABLE) ×2 IMPLANT
GOWN STRL REUS W/TWL XL LVL3 (GOWN DISPOSABLE) ×2
KIT BASIN OR (CUSTOM PROCEDURE TRAY) ×2 IMPLANT
KIT ROOM TURNOVER OR (KITS) ×2 IMPLANT
MANIFOLD NEPTUNE II (INSTRUMENTS) ×2 IMPLANT
PACK ARTHROSCOPY DSU (CUSTOM PROCEDURE TRAY) ×2 IMPLANT
PAD ABD 8X10 STRL (GAUZE/BANDAGES/DRESSINGS) ×2 IMPLANT
PAD ARMBOARD 7.5X6 YLW CONV (MISCELLANEOUS) ×4 IMPLANT
PENCIL BUTTON HOLSTER BLD 10FT (ELECTRODE) IMPLANT
SET ARTHROSCOPY TUBING (MISCELLANEOUS) ×1
SET ARTHROSCOPY TUBING LN (MISCELLANEOUS) ×1 IMPLANT
SPONGE GAUZE 4X4 12PLY (GAUZE/BANDAGES/DRESSINGS) ×2 IMPLANT
STRIP CLOSURE SKIN 1/2X4 (GAUZE/BANDAGES/DRESSINGS) ×2 IMPLANT
SUT MNCRL AB 4-0 PS2 18 (SUTURE) ×2 IMPLANT
TOWEL OR 17X24 6PK STRL BLUE (TOWEL DISPOSABLE) ×4 IMPLANT
TUBE CONNECTING 12X1/4 (SUCTIONS) ×2 IMPLANT
WAND 90 DEG TURBOVAC W/CORD (SURGICAL WAND) IMPLANT
WATER STERILE IRR 1000ML POUR (IV SOLUTION) ×2 IMPLANT
WRAP KNEE MAXI GEL POST OP (GAUZE/BANDAGES/DRESSINGS) ×2 IMPLANT

## 2013-09-22 NOTE — Interval H&P Note (Signed)
History and Physical Interval Note:  09/22/2013 2:14 PM  Thomas Dudley  has presented today for surgery, with the diagnosis of LEFT KNEE MENISCAL TEAR   The various methods of treatment have been discussed with the patient and family. After consideration of risks, benefits and other options for treatment, the patient has consented to  Procedure(s): LEFT KNEE ARTHROSCOPY  (Left) as a surgical intervention .  The patient's history has been reviewed, patient examined, no change in status, stable for surgery.  I have reviewed the patient's chart and labs.  Questions were answered to the patient's satisfaction.     Verlee RossettiSteven R Hurley Sobel

## 2013-09-22 NOTE — Anesthesia Postprocedure Evaluation (Signed)
Anesthesia Post Note  Patient: Thomas Dudley  Procedure(s) Performed: Procedure(s) (LRB): LEFT KNEE ARTHROSCOPY  (Left)  Anesthesia type: General  Patient location: PACU  Post pain: Pain level controlled and Adequate analgesia  Post assessment: Post-op Vital signs reviewed, Patient's Cardiovascular Status Stable, Respiratory Function Stable, Patent Airway and Pain level controlled  Last Vitals:  Filed Vitals:   09/22/13 1600  BP:   Pulse:   Temp: 36.4 C  Resp:     Post vital signs: Reviewed and stable  Level of consciousness: awake, alert  and oriented  Complications: No apparent anesthesia complications

## 2013-09-22 NOTE — Anesthesia Procedure Notes (Signed)
Procedure Name: LMA Insertion Date/Time: 09/22/2013 2:22 PM Performed by: Sharlene DoryWALKER, Judeth Gilles E Pre-anesthesia Checklist: Patient identified, Emergency Drugs available, Suction available, Patient being monitored and Timeout performed Patient Re-evaluated:Patient Re-evaluated prior to inductionOxygen Delivery Method: Circle system utilized Preoxygenation: Pre-oxygenation with 100% oxygen Intubation Type: IV induction Ventilation: Mask ventilation without difficulty LMA: LMA inserted LMA Size: 4.0 Number of attempts: 1 Tube secured with: Tape Dental Injury: Teeth and Oropharynx as per pre-operative assessment

## 2013-09-22 NOTE — Anesthesia Preprocedure Evaluation (Addendum)
Anesthesia Evaluation  Patient identified by MRN, date of birth, ID band Patient awake    Reviewed: Allergy & Precautions, H&P , NPO status , Patient's Chart, lab work & pertinent test results  Airway Mallampati: II  Neck ROM: full    Dental  (+) Edentulous Upper, Partial Lower   Pulmonary sleep apnea , Current Smoker,          Cardiovascular hypertension, + CAD, + Past MI and + Peripheral Vascular Disease     Neuro/Psych Depression    GI/Hepatic GERD-  ,  Endo/Other    Renal/GU      Musculoskeletal  (+) Arthritis -,   Abdominal   Peds  Hematology   Anesthesia Other Findings   Reproductive/Obstetrics                          Anesthesia Physical Anesthesia Plan  ASA: III  Anesthesia Plan: General   Post-op Pain Management:    Induction: Intravenous  Airway Management Planned: LMA  Additional Equipment:   Intra-op Plan:   Post-operative Plan:   Informed Consent: I have reviewed the patients History and Physical, chart, labs and discussed the procedure including the risks, benefits and alternatives for the proposed anesthesia with the patient or authorized representative who has indicated his/her understanding and acceptance.     Plan Discussed with: CRNA, Anesthesiologist and Surgeon  Anesthesia Plan Comments:         Anesthesia Quick Evaluation

## 2013-09-22 NOTE — Transfer of Care (Signed)
Immediate Anesthesia Transfer of Care Note  Patient: Thomas Dudley  Procedure(s) Performed: Procedure(s): LEFT KNEE ARTHROSCOPY  (Left)  Patient Location: PACU  Anesthesia Type:General  Level of Consciousness: lethargic and responds to stimulation  Airway & Oxygen Therapy: Patient Spontanous Breathing and Patient connected to nasal cannula oxygen  Post-op Assessment: Report given to PACU RN  Post vital signs: Reviewed and stable  Complications: No apparent anesthesia complications

## 2013-09-22 NOTE — Brief Op Note (Signed)
09/22/2013  3:31 PM  PATIENT:  Thomas Dudley  53 y.o. 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  PROCEDURE:  Procedure(s): LEFT KNEE ARTHROSCOPY  (Left)partial lateral meniscectomy, chondroplasty to bleeding bone three compartments, decompression of ACL cyst  SURGEON:  Surgeon(s) and Role:    * Verlee RossettiSteven R Misha Vanoverbeke, MD - Primary  PHYSICIAN ASSISTANT:   ASSISTANTS: none   ANESTHESIA:   general  EBL:  Total I/O In: 500 [I.V.:500] Out: -   BLOOD ADMINISTERED:none  DRAINS: none   LOCAL MEDICATIONS USED:  MARCAINE     SPECIMEN:  No Specimen  DISPOSITION OF SPECIMEN:  N/A  COUNTS:  ACTION TAKEN: None  TOURNIQUET:  * No tourniquets in log *  DICTATION: .Other Dictation: Dictation Number [id number]  PLAN OF CARE: Discharge to home after PACU  PATIENT DISPOSITION:  PACU - hemodynamically stable.   Delay start of Pharmacological VTE agent (>24hrs) due to surgical blood loss or risk of bleeding: no

## 2013-09-22 NOTE — Discharge Instructions (Signed)
Ice the knee and elevate the leg when possible.  Partial weight on the leg through the weekend and then increase weight bearing next week.  Do not drive until you can walk without a limp.  Keep the incisions clean and dry for 5 days, then ok to get wet in the shower.  Leave the surgical bandage on until Sunday, then ok to remove the ACE wrap and the white gauze, then apply BandAids to the top of the Steri Strips and pull up the TED hose on the operative leg.    Do exercises every hour for five minutes - calf pumps,  Heel slides,  Straight leg raises.  Follow up in the office in two weeks  [phone]  What to eat:  For your first meals, you should eat lightly; only small meals initially.  If you do not have nausea, you may eat larger meals.  Avoid spicy, greasy and heavy food.    General Anesthesia, Adult, Care After  Refer to this sheet in the next few weeks. These instructions provide you with information on caring for yourself after your procedure. Your health care provider may also give you more specific instructions. Your treatment has been planned according to current medical practices, but problems sometimes occur. Call your health care provider if you have any problems or questions after your procedure.  WHAT TO EXPECT AFTER THE PROCEDURE  After the procedure, it is typical to experience:  Sleepiness.  Nausea and vomiting. HOME CARE INSTRUCTIONS  For the first 24 hours after general anesthesia:  Have a responsible person with you.  Do not drive a car. If you are alone, do not take public transportation.  Do not drink alcohol.  Do not take medicine that has not been prescribed by your health care provider.  Do not sign important papers or make important decisions.  You may resume a normal diet and activities as directed by your health care provider.  Change bandages (dressings) as directed.  If you have questions or problems that seem related to general anesthesia, call the hospital  and ask for the anesthetist or anesthesiologist on call. SEEK MEDICAL CARE IF:  You have nausea and vomiting that continue the day after anesthesia.  You develop a rash. SEEK IMMEDIATE MEDICAL CARE IF:  You have difficulty breathing.  You have chest pain.  You have any allergic problems. Document Released: 08/24/2000 Document Revised: 01/18/2013 Document Reviewed: 12/01/2012  [facility] Patient Information 2014 [employer] [place].

## 2013-09-22 NOTE — Progress Notes (Signed)
Orthopedic Tech Progress Note Patient Details:  Thomas Dudley [birth year] [id number]  Ortho Devices Type of Ortho Device: Crutches Ortho Device/Splint Interventions: Application   Mickie BailJennifer Carol Cammer 09/22/2013, 5:20 PM

## 2013-09-23 NOTE — Op Note (Signed)
NAME:  Thomas Dudley, Thomas Dudley               ACCOUNT NO.:  [id number]  MEDICAL RECORD NO.:  [id number]  LOCATION:  [facility]                         FACILITY:  [facility]  PHYSICIAN:  [name], M.D. DATE OF BIRTH:  Jul 07, 1960  DATE OF PROCEDURE:  09/22/2013 DATE OF DISCHARGE:  09/22/2013                              OPERATIVE REPORT   PREOPERATIVE DIAGNOSES: 1. Left knee pain secondary to lateral meniscus tear. 2. Acromioclavicular ganglion cyst.  POSTOPERATIVE DIAGNOSES: 1. Left knee pain secondary to lateral meniscus tear. 2. Acromioclavicular ganglion cyst. 3. Chondromalacia grade 3, compartments grade 3 and grade 4.  PROCEDURE PERFORMED:  Left knee arthroscopy with partial lateral meniscectomy.  Decompression of acromioclavicular cyst, chondroplasty 3 compartments to bleeding bone.  ATTENDING SURGEON:  [name], MD  ASSISTANT:  None.  ANESTHESIA:  LMA general anesthesia was used.  ESTIMATED BLOOD LOSS:  Minimal.  FLUID REPLACEMENT:  1200 mL crystalloids.  INSTRUMENT COUNTS:  Correct.  COMPLICATIONS:  There were no complications.  ANTIBIOTICS:  Perioperative antibiotics were given.  INDICATIONS:  The patient is a 53 year old male with worsening left knee pain secondary to an ACL ganglion cyst as well as symptomatic lateral meniscus tear.  The patient has failed conservative management and desires operative treatment to restore function. Informed consent obtained.  DESCRIPTION OF PROCEDURE:  After adequate level of anesthesia was achieved, the patient was positioned supine on the operating room table. Left leg correctly identified, placed in arthroscopic leg holder.  Right leg placed in a right leg holder.  Sterile prep and drape of left knee. We called a time-out. We then entered the knee using standard portals including superolateral outflow, anterolateral scope, and anteromedial working portals.  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 Loose flaps and  fibrillation, and large deficits and eburnated bone. There was also a pre significant medial plica that was inflamed.  We removed that using suction shaver.  We did a chondroplasty removing loose flaps and fibrillation with a motorized shaver.  We entered the medial compartment.  There was no medial meniscus tear noted.  We probed and visualized both surfaces of the meniscus and the meniscal roots. There was a skid type lesion in the femoral condyle grade 2 and 3 chondromalacia running front to back towards the notch.  We just removed some unstable loose cartilage from that with a tangential technique with a motorized shaver.  ACL had a large ganglion.  We decompressed that with a suction shaver.  The ACL was somewhat atrophic.  The PCL appeared to be intact to more normal.  There were some spurs around the notch. We entered the lateral compartment.  There was a lateral meniscus tear, it was a radial type tear.  Then on we through about the third of the meniscus.  We performed a partial meniscectomy with a basket forceps and motorized shaver back to smooth meniscal rim.  Three extensive chondromalacia of the lateral compartment requiring chondroplasty again with tangential technique trying not to remove any additional cartilage for smoothing down the rough areas.  We performed a left synovectomy. We needed anteriorly to be able to visualize all the articular surfaces. The trochlea was really in bad shape, felt  like the weightbearing portion of the condyles were in better shape.  Following this, we concluded surgery suturing the wounds with 4-0 Monocryl followed by Steri-Strips and sterile compressive bandage.  The patient tolerated the surgery well.     [name], M.D.     SRN/MEDQ  D:  09/22/2013  T:  09/23/2013  Job:  [id number]

## 2013-09-26 ENCOUNTER — Encounter (HOSPITAL_COMMUNITY): Payer: Self-pay | Admitting: Orthopedic Surgery

## 2013-10-02 ENCOUNTER — Other Ambulatory Visit (HOSPITAL_COMMUNITY): Payer: Self-pay | Admitting: Orthopedic Surgery

## 2013-10-02 ENCOUNTER — Ambulatory Visit (HOSPITAL_COMMUNITY)
Admission: RE | Admit: 2013-10-02 | Discharge: 2013-10-02 | Disposition: A | Discharge status: Home / Self Care | Payer: BC Managed Care – PPO | Source: Ambulatory Visit | Attending: Orthopedic Surgery | Admitting: Orthopedic Surgery

## 2013-10-02 DIAGNOSIS — M7989 Other specified soft tissue disorders: Secondary | ICD-10-CM

## 2013-10-02 DIAGNOSIS — M79609 Pain in unspecified limb: Secondary | ICD-10-CM

## 2013-10-02 NOTE — Progress Notes (Signed)
VASCULAR LAB PRELIMINARY  PRELIMINARY  PRELIMINARY  PRELIMINARY  Left lower extremity venous Doppler completed.    Preliminary report:  There is no DVT or SVT noted in the left lower extremity.   Kern AlbertaCandace R Sierra Bissonette, RVT 10/02/2013, 2:59 PM

## 2013-11-02 ENCOUNTER — Other Ambulatory Visit: Payer: BC Managed Care – PPO

## 2013-11-08 ENCOUNTER — Other Ambulatory Visit: Payer: Self-pay | Admitting: Cardiology

## 2013-12-12 ENCOUNTER — Other Ambulatory Visit: Payer: Self-pay | Admitting: Cardiology

## 2014-01-15 ENCOUNTER — Other Ambulatory Visit: Payer: Self-pay | Admitting: Cardiology

## 2014-04-18 ENCOUNTER — Other Ambulatory Visit: Payer: Self-pay

## 2014-04-18 MED ORDER — CLOPIDOGREL BISULFATE 75 MG PO TABS: ORAL_TABLET | ORAL | Status: DC

## 2014-04-18 MED ORDER — ATORVASTATIN CALCIUM 80 MG PO TABS: ORAL_TABLET | ORAL | Status: DC

## 2014-04-18 MED ORDER — LISINOPRIL 20 MG PO TABS: ORAL_TABLET | ORAL | Status: DC

## 2014-04-18 MED ORDER — METOPROLOL SUCCINATE ER 50 MG PO TB24: ORAL_TABLET | ORAL | Status: DC

## 2014-04-18 MED ORDER — PANTOPRAZOLE SODIUM 40 MG PO TBEC: 40.0000 mg | DELAYED_RELEASE_TABLET | Freq: Every day | ORAL | Status: DC

## 2014-04-25 ENCOUNTER — Telehealth: Payer: Self-pay | Admitting: Cardiology

## 2014-04-25 NOTE — Telephone Encounter (Signed)
New Message         Pt calling stating that his rx was sent to his pharmacy and they have questions. Reference Number: [id number], [facility] [phone]. Please call back and advise.

## 2014-04-25 NOTE — Telephone Encounter (Signed)
Clarified for technician patient is to take 1.5 tablets metoprolol daily.   Left message for patient on private VM that [facility] has been called.

## 2014-05-18 ENCOUNTER — Ambulatory Visit (INDEPENDENT_AMBULATORY_CARE_PROVIDER_SITE_OTHER): Payer: BC Managed Care – PPO | Admitting: Cardiology

## 2014-05-18 ENCOUNTER — Encounter: Payer: Self-pay | Admitting: Cardiology

## 2014-05-18 VITALS — BP 172/100 | HR 77 | Ht 70.0 in | Wt 213.4 lb

## 2014-05-18 DIAGNOSIS — I2583 Coronary atherosclerosis due to lipid rich plaque: Principal | ICD-10-CM

## 2014-05-18 DIAGNOSIS — I10 Essential (primary) hypertension: Secondary | ICD-10-CM

## 2014-05-18 DIAGNOSIS — G4733 Obstructive sleep apnea (adult) (pediatric): Secondary | ICD-10-CM

## 2014-05-18 DIAGNOSIS — I2510 Atherosclerotic heart disease of native coronary artery without angina pectoris: Secondary | ICD-10-CM

## 2014-05-18 DIAGNOSIS — I252 Old myocardial infarction: Secondary | ICD-10-CM

## 2014-05-18 DIAGNOSIS — E785 Hyperlipidemia, unspecified: Secondary | ICD-10-CM

## 2014-05-18 MED ORDER — METOPROLOL SUCCINATE ER 50 MG PO TB24: 100.0000 mg | ORAL_TABLET | Freq: Every day | ORAL | Status: DC

## 2014-05-18 MED ORDER — ASPIRIN 81 MG PO TABS: 81.0000 mg | ORAL_TABLET | Freq: Every day | ORAL | Status: AC

## 2014-05-18 MED ORDER — VARENICLINE TARTRATE 1 MG PO TABS: 1.0000 mg | ORAL_TABLET | Freq: Two times a day (BID) | ORAL | Status: DC

## 2014-05-18 MED ORDER — VARENICLINE TARTRATE 0.5 MG X 11 & 1 MG X 42 PO MISC: ORAL | Status: DC

## 2014-05-18 MED ORDER — METOPROLOL SUCCINATE ER 50 MG PO TB24: 50.0000 mg | ORAL_TABLET | Freq: Every day | ORAL | Status: DC

## 2014-05-18 MED ORDER — METOPROLOL SUCCINATE ER 25 MG PO TB24: 25.0000 mg | ORAL_TABLET | Freq: Every day | ORAL | Status: DC

## 2014-05-18 NOTE — Progress Notes (Signed)
[address] [place], [place]  [address] Phone: [phone] Fax:  [phone]  Date:  05/18/2014   ID:  Thomas BreathDonald R Lansky, DOB 09-Jan-1961, MRN [id number]  PCP:  PROVIDER NOT IN SYSTEM  Cardiologist:  Armanda Magicraci Turner, MD    History of Present Illness: Thomas Dudley is a 53 y.o. male with a history of HTN, OSA, dyslipidemia and CAD  In setting of STEMI with PCI of PL in [place] who presents for followup. He is doing well. He denies any chest pain, SOE, DOE, LE edema, dizziness, palpitations or syncope. He uses he CPAP most of the time. He had cut significantly back on his tobacco but is now back smoking cigarettes about 10 a day.    Wt Readings from Last 3 Encounters:  05/18/14 213 lb 6.4 oz (96.798 kg)  09/22/13 218 lb 6 oz (99.054 kg)  09/14/13 218 lb 0.6 oz (98.901 kg)     Past Medical History  Diagnosis Date  . Hypercholesteremia     takes Lipitora daily  . PVD (peripheral vascular disease)   . Allergic rhinitis   . Diverticulitis   . Hemorrhage of rectum and anus   . Back pain   . Insomnia     takes Ambien nightly  . Dyslipidemia   . Gastritis   . Tobacco abuse   . Coronary atherosclerosis of native coronary artery     post PCI of obtuse marginal in 2004 with no nonobstructive disease in other territories, cath 04/2008 PCI of OM2, PDA and distal RCA, s/p STEMI with PCI of right PL and [place] LV[place]  . HTN (hypertension)     takes Lisinopril and Metoprolol daily  . GERD (gastroesophageal reflux disease)     takes Protonix daily  . Myocardial infarction 09/10/12  . Pneumonia     hx of ;about 52137yrs ago  . Arthritis   . Joint pain   . History of colon polyps   . Depression     takes Wellbutrin daily  . OSA (obstructive sleep apnea)     sleep study done > 80137yrs ago;uses CPAP    Current Outpatient Prescriptions  Medication Sig Dispense Refill  . aspirin 81 MG tablet Take 1 tablet (81 mg total) by mouth daily.    Marland Kitchen.  atorvastatin (LIPITOR) 80 MG tablet TAKE 1 TABLET DAILY (Patient taking differently: TAKE 1 TABLET BY MOUTH DAILY) 90 tablet 0  . buPROPion (WELLBUTRIN XL) 150 MG 24 hr tablet Take 1 tablet by mouth daily.    . clopidogrel (PLAVIX) 75 MG tablet TAKE 1 TABLET DAILY (Patient taking differently: TAKE 1 TABLET BY MOUTH DAILY) 90 tablet 0  . HYDROcodone-acetaminophen (NORCO/VICODIN) 5-325 MG per tablet Take 1 tablet by mouth every 6 (six) hours as needed. PAIN  0  . lisinopril (PRINIVIL,ZESTRIL) 20 MG tablet TAKE 1 TABLET DAILY (Patient taking differently: TAKE 1 TABLET BY MOUTH DAILY) 90 tablet 0  . nitroGLYCERIN (NITROSTAT) 0.4 MG SL tablet Place 0.4 mg under the tongue every 5 (five) minutes as needed for chest pain (MAX 3 TABLETS).     . pantoprazole (PROTONIX) 40 MG tablet Take 1 tablet (40 mg total) by mouth daily. 90 tablet 0  . zolpidem (AMBIEN) 5 MG tablet Take 5 mg by mouth at bedtime as needed for sleep.    . metoprolol succinate (TOPROL XL) 25 MG 24 hr tablet Take 1 tablet (25 mg total) by mouth daily.    . metoprolol succinate (  TOPROL XL) 50 MG 24 hr tablet Take 1 tablet (50 mg total) by mouth daily. Take with or immediately following a meal.    . varenicline (CHANTIX CONTINUING MONTH PAK) 1 MG tablet Take 1 tablet (1 mg total) by mouth 2 (two) times daily. 60 tablet 1  . varenicline (CHANTIX STARTING MONTH PAK) 0.5 MG X 11 & 1 MG X 42 tablet Take one 0.5 mg tablet by mouth once daily for 3 days, then increase to one 0.5 mg tablet twice daily for 4 days, then increase to one 1 mg tablet twice daily. 53 tablet 0   No current facility-administered medications for this visit.    Allergies:    Allergies  Allergen Reactions  . Neosporin [Neomycin-Bacitracin Zn-Polymyx] Other (See Comments)    Blisters     Social History:  The patient  reports that he has been smoking.  He does not have any smokeless tobacco history on file. He reports that he drinks alcohol. He reports that he does not use  illicit drugs.   Family History:  The patient's family history includes Breast cancer in his mother; Heart disease in his mother; Lung cancer in his father.   ROS:  Please see the history of present illness.      All other systems reviewed and negative.   PHYSICAL EXAM: VS:  BP 172/100 mmHg  Pulse 77  Ht 5\' 10"  (1.778 m)  Wt 213 lb 6.4 oz (96.798 kg)  BMI 30.62 kg/m2  SpO2 97% Well nourished, well developed, in no acute distress HEENT: normal Neck: no JVD Cardiac:  normal S1, S2; RRR; no murmur Lungs:  clear to auscultation bilaterally, no wheezing, rhonchi or rales Abd: soft, nontender, no hepatomegaly Ext: no edema Skin: warm and dry Neuro:  CNs 2-12 intact, no focal abnormalities noted   ASSESSMENT AND PLAN:  1. ASCAD s/p remote STEMI with PCI of PL with no further angina - continue ASA/Plavix  - decrease ASA to 81mg  daily 2. HTN - poorly controlled  - continue Lisinopril/Toprol  - increase Toprol to 100mg  daily  - check BP daily for a week and call with results 3. dyslipdiemia  - continue statin   - check FLP and ALT 4. OSA - fairly compliant with his CPAP - I have encouarged him to use his CPAP nightly  - [facility] 5. Obesity - he has been walking for exercise 6. Tobacco abuse - he has started back smoking again.  He would like to try  Chantix again so I will give him a prescription  Followup with me in 6 months   Signed, Armanda Magicraci Turner, MD [facility] 05/18/2014 4:13 PM

## 2014-05-18 NOTE — Patient Instructions (Addendum)
Your physician has recommended you make the following change in your medication: 1) START Chantix. Take as directed.  2) DECREASE Aspirin to 81 mg daily 3) INCREASE METOPROLOL to 100 mg daily  You have a FASTING LAB APPOINTMENT Wednesday, Dec. 23.  Your physician wants you to follow-up in: 6 months with Dr. Mayford Knifeurner. You will receive a reminder letter in the mail two months in advance. If you don't receive a letter, please call our office to schedule the follow-up appointment.

## 2014-05-23 ENCOUNTER — Other Ambulatory Visit (INDEPENDENT_AMBULATORY_CARE_PROVIDER_SITE_OTHER): Payer: BC Managed Care – PPO | Admitting: *Deleted

## 2014-05-23 DIAGNOSIS — I10 Essential (primary) hypertension: Secondary | ICD-10-CM

## 2014-05-23 DIAGNOSIS — I2583 Coronary atherosclerosis due to lipid rich plaque: Principal | ICD-10-CM

## 2014-05-23 DIAGNOSIS — I2510 Atherosclerotic heart disease of native coronary artery without angina pectoris: Secondary | ICD-10-CM

## 2014-05-23 DIAGNOSIS — E785 Hyperlipidemia, unspecified: Secondary | ICD-10-CM

## 2014-05-23 LAB — LIPID PANEL
Cholesterol: 131 mg/dL (ref 0–200)
HDL: 32.7 mg/dL — ABNORMAL LOW (ref >39.00)
LDL Cholesterol: 75 mg/dL (ref 0–99)
NonHDL: 98.3
Total CHOL/HDL Ratio: 4
Triglycerides: 115 mg/dL (ref 0.0–149.0)
VLDL: 23 mg/dL (ref 0.0–40.0)

## 2014-05-23 LAB — ALT: ALT: 25 U/L (ref 0–53)

## 2014-05-23 NOTE — Addendum Note (Signed)
Addended by: Tonita PhoenixBOWDEN, Ondra Deboard K on: 05/23/2014 08:09 AM   Modules accepted: Orders

## 2014-05-30 ENCOUNTER — Telehealth: Payer: Self-pay

## 2014-05-30 NOTE — Telephone Encounter (Signed)
Pt st that he does not want to start another medication just yet. He wants to see if he can get his LDL down on his own since it is only 75. He reported his blood pressure readings while on the phone. 12/20 BP 160/101 12/21 BP 172/99 12/22 BP 160/100 12/23 BP 148/100 12/24 BP 158/98 12/25 BP 157/100 12/26 BP 163/100 12/27 BP 151/92  He reports that these readings were taken BEFORE he took his medication in the morning. Advised the patient to check his BP daily for a week 2 hours AFTER taking his medication and to call back with results. Instructed the patient to improve his diet and increase exercise in the next week as well and to let me know when we talk next week if he wants Zetia called in.  Patient agrees with treatment plan.

## 2014-05-30 NOTE — Telephone Encounter (Signed)
-----   Message from Quintella Reichertraci R Turner, MD sent at 05/28/2014 11:55 AM EST ----- LDL still not at goal - please add Zetia 10mg  daily and recheck FLP and ALT in 8 weeks

## 2014-05-30 NOTE — Telephone Encounter (Signed)
Since BP is still very high - please have him increase Lisinopril to 40mg  daily and have him check his BP 2 hours after meds daily for a week and call.  ALso needs a BMET in 1 week

## 2014-06-08 NOTE — Telephone Encounter (Signed)
Patient st he does not want to start another medication at this time. He has started checking his BP after he takes his medicine and he st it has been in the 130's-140's/80's.   He also states that he has changed his diet and is eating less salt. He is to call next week with blood pressure readings to reevaluate plan.

## 2014-06-14 ENCOUNTER — Telehealth: Payer: Self-pay | Admitting: Cardiology

## 2014-06-14 NOTE — Telephone Encounter (Signed)
Will forward to Karsten FellsKaty Kemp RN and Dr. Mayford Knifeurner. Patient verbalized understanding.

## 2014-06-14 NOTE — Telephone Encounter (Signed)
New message     Pt c/o BP issue:  1. What are your last 5 BP readings? 12-31 152/81; 1-1- 138/81; 1-2- 156/89; 1-3 132/85; 1-9 133/77; 1-10 144/83; 1-11 133/85; 1-12 132/83 2. Are you having any other symptoms (ex. Dizziness, headache, blurred vision, passed out)? no 3. What is your medication issue?  [facility]

## 2014-06-14 NOTE — Telephone Encounter (Signed)
Bp readings are fine - continue on current meds

## 2014-06-15 NOTE — Telephone Encounter (Signed)
Instructed patient to continue current medications. Patient agrees with treatment plan. 

## 2014-07-17 ENCOUNTER — Other Ambulatory Visit: Payer: Self-pay | Admitting: Cardiology

## 2014-07-20 ENCOUNTER — Other Ambulatory Visit: Payer: Self-pay | Admitting: *Deleted

## 2014-07-20 MED ORDER — METOPROLOL SUCCINATE ER 100 MG PO TB24: 100.0000 mg | ORAL_TABLET | Freq: Every day | ORAL | Status: DC

## 2014-07-23 ENCOUNTER — Other Ambulatory Visit: Payer: Self-pay | Admitting: *Deleted

## 2014-07-23 MED ORDER — CLOPIDOGREL BISULFATE 75 MG PO TABS
75.0000 mg | ORAL_TABLET | Freq: Once | ORAL | Status: DC
Start: 2014-07-23 — End: 2016-01-23

## 2014-08-10 ENCOUNTER — Encounter: Payer: Self-pay | Admitting: Cardiology

## 2014-09-17 ENCOUNTER — Other Ambulatory Visit: Payer: Self-pay | Admitting: Cardiology

## 2014-10-01 ENCOUNTER — Telehealth: Payer: Self-pay | Admitting: Cardiology

## 2014-10-01 DIAGNOSIS — G4733 Obstructive sleep apnea (adult) (pediatric): Secondary | ICD-10-CM

## 2014-10-01 NOTE — Telephone Encounter (Signed)
Spoke with patient and he said that his machine will not do anything since falling off the nightstand. I have sent a message to [facility] to check eligibility of new machine, then I will start the process of putting in new orders. Patient is aware and stated verbal understanding of the process.

## 2014-10-01 NOTE — Telephone Encounter (Signed)
Received a note from [employer] [employer] ([facility])stating that he is eligible for a new machine. He needs an OV and an order with the current pressure setting.We rescheduled his June appt to 10/04/14 and we will place orders based on that office visit.  I informed the patient of this and he stated verbal understanding.

## 2014-10-01 NOTE — Telephone Encounter (Signed)
New Message  Pt calling to speak w. Rn about orders for new CPAP machine. Per pt- machine fell off nightstand and needs new one. Please call back and dsicuss.

## 2014-10-03 NOTE — Progress Notes (Signed)
Cardiology Office Note   Date:  10/04/2014   ID:  Thomas Dudley, DOB 1961/01/30, MRN [id number]  PCP:  PROVIDER NOT IN SYSTEM    Chief Complaint  Patient presents with  . Coronary Artery Disease  . Follow-up    hypertension  . Follow-up    hyperlipidemia  . Follow-up    sleep Apnea      History of Present Illness: Thomas BreathDonald R Ates is a 54 y.o. male with a history of HTN, OSA, dyslipidemia and CAD in the setting of STEMI with PCI of PL in [place] who presents for followup. He is doing well. He denies any chest pain, SOE, DOE, LE edema, dizziness, palpitations or syncope. He uses he CPAP most of the time.He tolerates the mask and feels the pressure is fine.  He feels rested in the am when he uses it.  Currently he is not using it because it fell off the bedside table and broka and he needs a new one.   He had quit smoking all together.   Past Medical History  Diagnosis Date  . Hypercholesteremia     takes Lipitora daily  . PVD (peripheral vascular disease)   . Allergic rhinitis   . Diverticulitis   . Hemorrhage of rectum and anus   . Back pain   . Insomnia     takes Ambien nightly  . Dyslipidemia   . Gastritis   . Tobacco abuse   . Coronary atherosclerosis of native coronary artery     post PCI of obtuse marginal in 2004 with no nonobstructive disease in other territories, cath 04/2008 PCI of OM2, PDA and distal RCA, s/p STEMI with PCI of right PL and normal LVF [place]  . HTN (hypertension)     takes Lisinopril and Metoprolol daily  . GERD (gastroesophageal reflux disease)     takes Protonix daily  . Myocardial infarction 09/10/12  . Pneumonia     hx of ;about 5262yrs ago  . Arthritis   . Joint pain   . History of colon polyps   . Depression     takes Wellbutrin daily  . OSA (obstructive sleep apnea)     sleep study done > 862yrs ago;uses CPAP    Past Surgical History  Procedure Laterality Date  . Tonsillectomy    . Adenoidectomy      . Nasal septoplasty w/ turbinoplasty    . Carpal tunnel release Right   . Colonoscopy    . Cardiac catheterization      multiple  . Coronary angioplasty      total of 6 stents   . Knee arthroscopy Left 09/22/2013    Procedure: LEFT KNEE ARTHROSCOPY ;  Surgeon: Verlee RossettiSteven R Norris, MD;  [facility];  Service: Orthopedics;  Laterality: Left;     Current Outpatient Prescriptions  Medication Sig Dispense Refill  . aspirin 81 MG tablet Take 1 tablet (81 mg total) by mouth daily.    Marland Kitchen. atorvastatin (LIPITOR) 80 MG tablet Take 1 tablet (80 mg total) by mouth daily. 90 tablet 0  . buPROPion (WELLBUTRIN XL) 150 MG 24 hr tablet Take 1 tablet by mouth daily.    . clopidogrel (PLAVIX) 75 MG tablet Take 1 tablet (75 mg total) by mouth once. 90 tablet 0  . HYDROcodone-acetaminophen (NORCO/VICODIN) 5-325 MG per tablet Take 1 tablet by mouth every 6 (six) hours as needed. PAIN  0  . lisinopril (PRINIVIL,ZESTRIL) 40 MG tablet Take 1  tablet (40 mg total) by mouth daily. 90 tablet 1  . metoprolol succinate (TOPROL-XL) 100 MG 24 hr tablet Take 1 tablet (100 mg total) by mouth daily. Take with or immediately following a meal. 90 tablet 0  . nitroGLYCERIN (NITROSTAT) 0.4 MG SL tablet Place 0.4 mg under the tongue every 5 (five) minutes as needed for chest pain (MAX 3 TABLETS).     . pantoprazole (PROTONIX) 40 MG tablet Take 1 tablet (40 mg total) by mouth daily. 90 tablet 1  . varenicline (CHANTIX CONTINUING MONTH PAK) 1 MG tablet Take 1 tablet (1 mg total) by mouth 2 (two) times daily. 60 tablet 1  . zolpidem (AMBIEN) 10 MG tablet Take 10 mg by mouth as needed. For sleep  0   No current facility-administered medications for this visit.    Allergies:   Neosporin    Social History:  The patient  reports that he has been smoking.  He does not have any smokeless tobacco history on file. He reports that he drinks alcohol. He reports that he does not use illicit drugs.   Family History:  The patient's family  history includes Breast cancer in his mother; Heart disease in his mother; Lung cancer in his father.    ROS:  Please see the history of present illness.   Otherwise, review of systems are positive for none.   All other systems are reviewed and negative.    PHYSICAL EXAM: VS:  BP 156/88 mmHg  Pulse 70  Ht 5\' 11"  (1.803 m)  Wt 206 lb (93.441 kg)  BMI 28.74 kg/m2  SpO2 95% , BMI Body mass index is 28.74 kg/(m^2). GEN: Well nourished, well developed, in no acute distress HEENT: normal Neck: no JVD, carotid bruits, or masses Cardiac: RRR; no murmurs, rubs, or gallops,no edema  Respiratory:  clear to auscultation bilaterally, normal work of breathing GI: soft, nontender, nondistended, + BS MS: no deformity or atrophy Skin: warm and dry, no rash Neuro:  Strength and sensation are intact Psych: euthymic mood, full affect   EKG:  EKG is not ordered today.    Recent Labs: 05/23/2014: ALT 25    Lipid Panel    Component Value Date/Time   CHOL 131 05/23/2014 0809   TRIG 115.0 05/23/2014 0809   HDL 32.70* 05/23/2014 0809   CHOLHDL 4 05/23/2014 0809   VLDL 23.0 05/23/2014 0809   LDLCALC 75 05/23/2014 0809      Wt Readings from Last 3 Encounters:  10/04/14 206 lb (93.441 kg)  05/18/14 213 lb 6.4 oz (96.798 kg)  09/22/13 218 lb 6 oz (99.054 kg)    ASSESSMENT AND PLAN:  1. ASCAD s/p remote STEMI with PCI of PL with no further angina - continue ASA/Plavix 2. HTN -  Borderline controlled - I have asked him to check his BP daily for a week and call - continue Lisinopril/Toprol 3. Dyslipdiemia  - continue statin  - check FLP and ALT 4. OSA - fairly compliant with his CPAP - I have encouarged him to use his CPAP nightly  - I will order him a new CPAP device 5. Obesity - he has been walking for exercise 6. Tobacco abuse - I congratulated him on quitting  Current medicines are reviewed at length with the patient  today.  The patient does not have concerns regarding medicines.  The following changes have been made:  no change  Labs/ tests ordered today include: see above assessment and plan No orders of the defined types  were placed in this encounter.     Disposition:   FU with me in 6 months   Signed, Quintella Reichert, MD  10/04/2014 3:15 PM    [facility] [address] [place], [place], [place]  [address] Phone: [phone]; Fax: [phone]

## 2014-10-04 ENCOUNTER — Encounter: Payer: Self-pay | Admitting: Cardiology

## 2014-10-04 ENCOUNTER — Ambulatory Visit (INDEPENDENT_AMBULATORY_CARE_PROVIDER_SITE_OTHER): Payer: BLUE CROSS/BLUE SHIELD | Admitting: Cardiology

## 2014-10-04 VITALS — BP 156/88 | HR 70 | Ht 71.0 in | Wt 206.0 lb

## 2014-10-04 DIAGNOSIS — I2510 Atherosclerotic heart disease of native coronary artery without angina pectoris: Secondary | ICD-10-CM | POA: Diagnosis not present

## 2014-10-04 DIAGNOSIS — Z720 Tobacco use: Secondary | ICD-10-CM

## 2014-10-04 DIAGNOSIS — I10 Essential (primary) hypertension: Secondary | ICD-10-CM

## 2014-10-04 DIAGNOSIS — I252 Old myocardial infarction: Secondary | ICD-10-CM | POA: Diagnosis not present

## 2014-10-04 DIAGNOSIS — G4733 Obstructive sleep apnea (adult) (pediatric): Secondary | ICD-10-CM

## 2014-10-04 DIAGNOSIS — I2583 Coronary atherosclerosis due to lipid rich plaque: Principal | ICD-10-CM

## 2014-10-04 DIAGNOSIS — E785 Hyperlipidemia, unspecified: Secondary | ICD-10-CM

## 2014-10-04 NOTE — Addendum Note (Signed)
Addended by: Gunnar FusiKEMP, Lilliemae Fruge A on: 10/04/2014 05:22 PM   Modules accepted: Orders

## 2014-10-04 NOTE — Patient Instructions (Signed)
Medication Instructions:  Your physician recommends that you continue on your current medications as directed. Please refer to the Current Medication list given to you today.   Labwork: You have a FASTING lab appointment on May 25.  Testing/Procedures: None  Follow-Up: Your physician wants you to follow-up in: 6 months with Dr. Mayford Knifeurner. You will receive a reminder letter in the mail two months in advance. If you don't receive a letter, please call our office to schedule the follow-up appointment.   Any Other Special Instructions Will Be Listed Below (If Applicable).

## 2014-10-04 NOTE — Telephone Encounter (Signed)
[facility] with [facility]. Per their records, we are to order an auto C-PAP from 5-[employer]cm and get a download in 2 weeks.  Order placed.

## 2014-10-05 ENCOUNTER — Telehealth: Payer: Self-pay | Admitting: Cardiology

## 2014-10-05 ENCOUNTER — Other Ambulatory Visit: Payer: Self-pay

## 2014-10-05 DIAGNOSIS — E785 Hyperlipidemia, unspecified: Secondary | ICD-10-CM

## 2014-10-05 NOTE — Telephone Encounter (Signed)
New message    Patient calling back to speak with nurse from yesterday office visit.

## 2014-10-05 NOTE — Telephone Encounter (Signed)
Patient upset because he called [facility] this morning and I "haven't done my job" because they do not have an order for the new CPAP. Explained to patient that the order was just placed yesterday and [facility] has to run insurance and get everything finalized before the order will show up system wide. Called [employer] [employer], and verified the order is in. She st she or another representative would call him today. Called patient back and left message that [facility] would [facility].

## 2014-10-07 ENCOUNTER — Other Ambulatory Visit: Payer: Self-pay | Admitting: Cardiology

## 2014-10-24 ENCOUNTER — Other Ambulatory Visit (INDEPENDENT_AMBULATORY_CARE_PROVIDER_SITE_OTHER): Payer: BLUE CROSS/BLUE SHIELD | Admitting: *Deleted

## 2014-10-24 DIAGNOSIS — E785 Hyperlipidemia, unspecified: Secondary | ICD-10-CM | POA: Diagnosis not present

## 2014-10-24 LAB — HEPATIC FUNCTION PANEL
ALT: 27 U/L (ref 0–53)
AST: 23 U/L (ref 0–37)
Albumin: 4 g/dL (ref 3.5–5.2)
Alkaline Phosphatase: 63 U/L (ref 39–117)
Bilirubin, Direct: 0.1 mg/dL (ref 0.0–0.3)
Total Bilirubin: 0.4 mg/dL (ref 0.2–1.2)
Total Protein: 6.8 g/dL (ref 6.0–8.3)

## 2014-10-24 LAB — LIPID PANEL
Cholesterol: 104 mg/dL (ref 0–200)
HDL: 39.1 mg/dL (ref >39.00)
LDL Cholesterol: 45 mg/dL (ref 0–99)
NonHDL: 64.9
Total CHOL/HDL Ratio: 3
Triglycerides: 101 mg/dL (ref 0.0–149.0)
VLDL: 20.2 mg/dL (ref 0.0–40.0)

## 2014-11-19 ENCOUNTER — Encounter: Payer: Self-pay | Admitting: Cardiology

## 2014-11-20 ENCOUNTER — Other Ambulatory Visit: Payer: Self-pay | Admitting: *Deleted

## 2014-11-20 DIAGNOSIS — G4733 Obstructive sleep apnea (adult) (pediatric): Secondary | ICD-10-CM

## 2014-11-28 ENCOUNTER — Ambulatory Visit: Payer: Self-pay | Admitting: Cardiology

## 2014-12-31 ENCOUNTER — Other Ambulatory Visit: Payer: Self-pay | Admitting: Cardiology

## 2015-01-16 ENCOUNTER — Encounter: Payer: Self-pay | Admitting: Cardiology

## 2015-03-11 ENCOUNTER — Other Ambulatory Visit: Payer: Self-pay | Admitting: Cardiology

## 2015-04-09 ENCOUNTER — Other Ambulatory Visit: Payer: Self-pay

## 2015-04-09 MED ORDER — NITROGLYCERIN 0.4 MG SL SUBL: 0.4000 mg | SUBLINGUAL_TABLET | SUBLINGUAL | Status: DC | PRN

## 2015-06-07 ENCOUNTER — Ambulatory Visit (INDEPENDENT_AMBULATORY_CARE_PROVIDER_SITE_OTHER): Payer: BLUE CROSS/BLUE SHIELD | Admitting: Cardiology

## 2015-06-07 VITALS — BP 168/100 | HR 80 | Ht 71.0 in | Wt 205.6 lb

## 2015-06-07 DIAGNOSIS — E785 Hyperlipidemia, unspecified: Secondary | ICD-10-CM

## 2015-06-07 DIAGNOSIS — G4733 Obstructive sleep apnea (adult) (pediatric): Secondary | ICD-10-CM

## 2015-06-07 DIAGNOSIS — I10 Essential (primary) hypertension: Secondary | ICD-10-CM | POA: Diagnosis not present

## 2015-06-07 DIAGNOSIS — I252 Old myocardial infarction: Secondary | ICD-10-CM

## 2015-06-07 DIAGNOSIS — I2510 Atherosclerotic heart disease of native coronary artery without angina pectoris: Secondary | ICD-10-CM

## 2015-06-07 DIAGNOSIS — I2583 Coronary atherosclerosis due to lipid rich plaque: Secondary | ICD-10-CM

## 2015-06-07 MED ORDER — AMLODIPINE BESYLATE 5 MG PO TABS: 5.0000 mg | ORAL_TABLET | Freq: Every day | ORAL | Status: DC

## 2015-06-07 NOTE — Progress Notes (Signed)
Cardiology Office Note   Date:  06/07/2015   ID:  [name], DOB May 22, 1961, MRN [id number]  PCP:  PROVIDER NOT IN SYSTEM    No chief complaint on file.     History of Present Illness: Thomas Dudley is a 54o. male with a history of HTN, OSA, dyslipidemia and CAD in the setting of STEMI with PCI of PL in [place] who presents for followup. He is doing well. He denies any chest pain, SOE, DOE, LE edema, dizziness, palpitations or syncope. He uses he CPAP most of the time but did not use it for the last month due to getting teeth pulled.He tolerates the mask and feels the pressure is fine. He feels rested in the am when he uses it.  He walks for exercise and rides a bike.  He tries to walk 3 miles 4 times weekly.    Past Medical History  Diagnosis Date  . Hypercholesteremia     takes Lipitora daily  . PVD (peripheral vascular disease)   . Allergic rhinitis   . Diverticulitis   . Hemorrhage of rectum and anus   . Back pain   . Insomnia     takes Ambien nightly  . Dyslipidemia   . Gastritis   . Tobacco abuse   . Coronary atherosclerosis of native coronary artery     post PCI of obtuse marginal in 2004 with no nonobstructive disease in other territories, cath 04/2008 PCI of OM2, PDA and distal RCA, s/p STEMI with PCI of right PL and normal LVF 08/2012 in [place]  . HTN (hypertension)     takes Lisinopril and Metoprolol daily  . GERD (gastroesophageal reflux disease)     takes Protonix daily  . Myocardial infarction (HCC) 09/10/12  . Pneumonia     hx of ;about 65yrs ago  . Arthritis   . Joint pain   . History of colon polyps   . Depression     takes Wellbutrin daily  . OSA (obstructive sleep apnea)     sleep study done > 11yrs ago;uses CPAP    Past Surgical History  Procedure Laterality Date  . Tonsillectomy    . Adenoidectomy    . Nasal septoplasty w/ turbinoplasty    . Carpal tunnel release Right   . Colonoscopy    . Cardiac  catheterization      multiple  . Coronary angioplasty      total of 6 stents   . Knee arthroscopy Left 09/22/2013    Procedure: LEFT KNEE ARTHROSCOPY ;  Surgeon: Verlee Rossetti, MD;  Location: [facility] OR;  Service: Orthopedics;  Laterality: Left;     Current Outpatient Prescriptions  Medication Sig Dispense Refill  . aspirin 81 MG tablet Take 1 tablet (81 mg total) by mouth daily.    Marland Kitchen atorvastatin (LIPITOR) 80 MG tablet TAKE 1 TABLET DAILY 90 tablet 3  . buPROPion (WELLBUTRIN XL) 150 MG 24 hr tablet Take 1 tablet by mouth daily.    . clopidogrel (PLAVIX) 75 MG tablet Take 1 tablet (75 mg total) by mouth once. (Patient taking differently: Take 75 mg by mouth daily. ) 90 tablet 0  . HYDROcodone-acetaminophen (NORCO/VICODIN) 5-325 MG per tablet Take 1 tablet by mouth every 6 (six) hours as needed. PAIN  0  . lisinopril (PRINIVIL,ZESTRIL) 40 MG tablet TAKE 1 TABLET DAILY 90 tablet 3  . metoprolol succinate (TOPROL-XL)  100 MG 24 hr tablet TAKE 1 TABLET DAILY WITH A MEAL OR IMMEDIATELY AFTER A MEAL 90 tablet 0  . nitroGLYCERIN (NITROSTAT) 0.4 MG SL tablet Place 1 tablet (0.4 mg total) under the tongue every 5 (five) minutes as needed for chest pain (MAX 3 TABLETS). 25 tablet 6  . pantoprazole (PROTONIX) 40 MG tablet TAKE 1 TABLET DAILY 90 tablet 3  . zolpidem (AMBIEN) 10 MG tablet Take 10 mg by mouth as needed. For sleep  0   No current facility-administered medications for this visit.    Allergies:   Neosporin    Social History:  The patient  reports that he has been smoking.  He does not have any smokeless tobacco history on file. He reports that he drinks alcohol. He reports that he does not use illicit drugs.   Family History:  The patient's family history includes Breast cancer in his mother; Heart disease in his mother; Lung cancer in his father.    ROS:  Please see the history of present illness.   Otherwise, review of systems are positive for none.   All other systems are reviewed and  negative.    PHYSICAL EXAM: VS:  BP 168/100 mmHg  Pulse 80  Ht 5\' 11"  (1.803 m)  Wt 205 lb 9.6 oz (93.26 kg)  BMI 28.69 kg/m2 , BMI Body mass index is 28.69 kg/(m^2). GEN: Well nourished, well developed, in no acute distress HEENT: normal Neck: no JVD, carotid bruits, or masses Cardiac: RRR; no murmurs, rubs, or gallops,no edema  Respiratory:  clear to auscultation bilaterally, normal work of breathing GI: soft, nontender, nondistended, + BS MS: no deformity or atrophy Skin: warm and dry, no rash Neuro:  Strength and sensation are intact Psych: euthymic mood, full affect   EKG:  EKG is not ordered today.   Recent Labs: 10/24/2014: ALT 27    Lipid Panel    Component Value Date/Time   CHOL 104 10/24/2014 0743   TRIG 101.0 10/24/2014 0743   HDL 39.10 10/24/2014 0743   CHOLHDL 3 10/24/2014 0743   VLDL 20.2 10/24/2014 0743   LDLCALC 45 10/24/2014 0743      Wt Readings from Last 3 Encounters:  06/07/15 205 lb 9.6 oz (93.26 kg)  10/04/14 206 lb (93.441 kg)  05/18/14 213 lb 6.4 oz (96.798 kg)     ASSESSMENT AND PLAN:  1. ASCAD s/p remote STEMI with PCI of PL with no further angina - continue ASA/Plavix/statin 2. HTN - poorly controlled  - continue Lisinopril/Toprol  - add amlodipine 5mg  daily  - followup in HTN clinic in 1 week  - I instructed him to follow a 2gm sodium diet daily 3. Dyslipdiemia - last LDL at goal  - continue statin  - check FLP and ALT 4. OSA - his d/l showed an AHI of 4.9/hr on 19cm H2O but only 50% compliant in using more than 4 hours nightly recently due to getting teeth pulled - I have encouarged him to use his CPAP nightly 5. Obesity - he has been walking for exercise 6. Tobacco abuse - I congratulated him on quitting   Current medicines are reviewed at length with the patient today.  The patient does not have concerns regarding medicines.  The following changes have been  made:  no change  Labs/ tests ordered today: See above Assessment and Plan No orders of the defined types were placed in this encounter.     Disposition:   FU with me in 6 months  Harlon FlorSigned, TURNER,TRACI R, MD  06/07/2015 3:11 PM    [facility] [address], [place], [place]  [address] Phone: [phone](336) [phone]; Fax: [phone]

## 2015-06-07 NOTE — Addendum Note (Signed)
Addended by: Tonita PhoenixBOWDEN, Krishauna Schatzman K on: 06/07/2015 04:10 PM   Modules accepted: Orders

## 2015-06-07 NOTE — Patient Instructions (Signed)
Medication Instructions:  Your physician has recommended you make the following change in your medication:  1) START AMLODIPINE 5 mg daily  Labwork: Lab work scheduled 1/11. Come FASTING for this appointment.  Testing/Procedures: None  Follow-Up: Your physician wants you to follow-up in: 6 months with Dr. Mayford Knifeurner. You will receive a reminder letter in the mail two months in advance. If you don't receive a letter, please call our office to schedule the follow-up appointment.   Any Other Special Instructions Will Be Listed Below (If Applicable).     If you need a refill on your cardiac medications before your next appointment, please call your pharmacy.

## 2015-06-12 ENCOUNTER — Other Ambulatory Visit: Payer: Self-pay | Admitting: Cardiology

## 2015-06-12 ENCOUNTER — Encounter: Payer: Self-pay | Admitting: Pharmacist

## 2015-06-12 ENCOUNTER — Ambulatory Visit (INDEPENDENT_AMBULATORY_CARE_PROVIDER_SITE_OTHER): Payer: BLUE CROSS/BLUE SHIELD | Admitting: Pharmacist

## 2015-06-12 ENCOUNTER — Other Ambulatory Visit (INDEPENDENT_AMBULATORY_CARE_PROVIDER_SITE_OTHER): Payer: BLUE CROSS/BLUE SHIELD | Admitting: *Deleted

## 2015-06-12 VITALS — BP 144/98 | HR 68

## 2015-06-12 DIAGNOSIS — E785 Hyperlipidemia, unspecified: Secondary | ICD-10-CM

## 2015-06-12 DIAGNOSIS — I10 Essential (primary) hypertension: Secondary | ICD-10-CM

## 2015-06-12 LAB — LIPID PANEL
Cholesterol: 120 mg/dL — ABNORMAL LOW (ref 125–200)
HDL: 39 mg/dL — ABNORMAL LOW (ref >=40)
LDL Cholesterol: 58 mg/dL (ref <130)
Total CHOL/HDL Ratio: 3.1 Ratio (ref <=5.0)
Triglycerides: 116 mg/dL (ref <150)
VLDL: 23 mg/dL (ref <30)

## 2015-06-12 LAB — HEPATIC FUNCTION PANEL
ALT: 30 U/L (ref 9–46)
AST: 23 U/L (ref 10–35)
Albumin: 4 g/dL (ref 3.6–5.1)
Alkaline Phosphatase: 58 U/L (ref 40–115)
Bilirubin, Direct: 0.2 mg/dL (ref <=0.2)
Indirect Bilirubin: 0.5 mg/dL (ref 0.2–1.2)
Total Bilirubin: 0.7 mg/dL (ref 0.2–1.2)
Total Protein: 6.8 g/dL (ref 6.1–8.1)

## 2015-06-12 MED ORDER — AMLODIPINE BESYLATE 10 MG PO TABS: 10.0000 mg | ORAL_TABLET | Freq: Every day | ORAL | Status: DC

## 2015-06-12 NOTE — Addendum Note (Signed)
Addended by: Tonita PhoenixBOWDEN, Dason Mosley K on: 06/12/2015 08:13 AM   Modules accepted: Orders

## 2015-06-12 NOTE — Progress Notes (Signed)
Patient ID:  Thomas Dudley                DOB:  [birth year]                        MRN:  [id number]     HPI: Thomas Dudley is a 55 y.o. male referred by Dr. Mayford Knifeurner to HTN clinic. Patient reports he has been feeling fine since starting amlodipine. He states he could have monitored his blood pressure at home and called rather than coming to the office and he is in a hurry to get to work.   Current HTN meds:  Amlodipine 5mg  daily Lisinopril 40mg   Daily Metoprolol XL 100mg  daily    BP goal: <140/90  Family History: Heart disease and breast cancer in his mother. Lung cancer in his father.   Social History: Patient reports he still carries his pack of cigarettes with him, but he only smokes about 3-4 cigarettes a week. He did have a cigarette early this morning. He states he only uses them when he is extremely stressed.   Exercise:  He reports he is very active at work and he works long days.   Home BP readings:  He has a cuff at home, but does not check his blood pressure.   Wt Readings from Last 3 Encounters:  06/07/15 205 lb 9.6 oz (93.26 kg)  10/04/14 206 lb (93.441 kg)  05/18/14 213 lb 6.4 oz (96.798 kg)   BP Readings from Last 3 Encounters:  06/12/15 144/98  06/07/15 168/100  10/04/14 156/88   Pulse Readings from Last 3 Encounters:  06/12/15 68  06/07/15 80  10/04/14 70    Renal function: CrCl cannot be calculated (Patient has no serum creatinine result on file.).  Past Medical History  Diagnosis Date  . Hypercholesteremia     takes Lipitora daily  . PVD (peripheral vascular disease) (HCC)   . Allergic rhinitis   . Diverticulitis   . Hemorrhage of rectum and anus   . Back pain   . Insomnia     takes Ambien nightly  . Dyslipidemia   . Gastritis   . Tobacco abuse   . Coronary atherosclerosis of native coronary artery     post PCI of obtuse marginal in 2004 with no nonobstructive disease in other territories, cath 04/2008 PCI of OM2, PDA and distal RCA, s/p  STEMI with PCI of right PL and [place]  . HTN (hypertension)     takes Lisinopril and Metoprolol daily  . GERD (gastroesophageal reflux disease)     takes Protonix daily  . Myocardial infarction (HCC) 09/10/12  . Pneumonia     hx of ;about 5834yrs ago  . Arthritis   . Joint pain   . History of colon polyps   . Depression     takes Wellbutrin daily  . OSA (obstructive sleep apnea)     sleep study done > 6334yrs ago;uses CPAP    Current Outpatient Prescriptions on File Prior to Visit  Medication Sig Dispense Refill  . aspirin 81 MG tablet Take 1 tablet (81 mg total) by mouth daily.    Marland Kitchen. atorvastatin (LIPITOR) 80 MG tablet TAKE 1 TABLET DAILY 90 tablet 3  . buPROPion (WELLBUTRIN XL) 150 MG 24 hr tablet Take 1 tablet by mouth daily.    . clopidogrel (PLAVIX) 75 MG tablet Take 1 tablet (75 mg total) by mouth once. (Patient taking differently: Take  75 mg by mouth daily. ) 90 tablet 0  . HYDROcodone-acetaminophen (NORCO/VICODIN) 5-325 MG per tablet Take 1 tablet by mouth every 6 (six) hours as needed. PAIN  0  . lisinopril (PRINIVIL,ZESTRIL) 40 MG tablet TAKE 1 TABLET DAILY 90 tablet 3  . metoprolol succinate (TOPROL-XL) 100 MG 24 hr tablet TAKE 1 TABLET DAILY WITH A MEAL OR IMMEDIATELY AFTER A MEAL 90 tablet 0  . pantoprazole (PROTONIX) 40 MG tablet TAKE 1 TABLET DAILY 90 tablet 3  . zolpidem (AMBIEN) 10 MG tablet Take 10 mg by mouth as needed. For sleep  0  . nitroGLYCERIN (NITROSTAT) 0.4 MG SL tablet Place 1 tablet (0.4 mg total) under the tongue every 5 (five) minutes as needed for chest pain (MAX 3 TABLETS). (Patient not taking: Reported on 06/12/2015) 25 tablet 6   No current facility-administered medications on file prior to visit.    Allergies  Allergen Reactions  . Neosporin [Neomycin-Bacitracin Zn-Polymyx] Other (See Comments)    Blisters      Assessment/Plan: Hypertension: Blood pressure is not at goal <140/36mmHg, but improved from previous visits  after the start of amlodipine. Will increase amlodipine to 10mg  today. Patient told to take two of the 5mg  tablets daily until he runs out then pick up the new strength at the pharmacy and start taking 1 tablet daily. He will also start monitoring his blood pressure a few times a week and bring the record to our next visit. Follow-up in hypertension clinic in 4 weeks.   Smoking Cessation: He is not ready to quit, but has dramatically cut down on the amount he is smoking. He is aware of the benefits of quitting.   Thank you, Freddie Apley. Cleatis Polka, PharmD  [facility]  [address], [place], [place] [address]  Phone: [phone]; Fax: [phone] 06/12/2015 9:48 AM

## 2015-06-12 NOTE — Addendum Note (Signed)
Addended by: Tonita PhoenixBOWDEN, ROBIN K on: 06/12/2015 08:21 AM   Modules accepted: Orders

## 2015-06-12 NOTE — Addendum Note (Signed)
Addended by: Tonita PhoenixBOWDEN, ROBIN K on: 06/12/2015 08:12 AM   Modules accepted: Orders

## 2015-06-12 NOTE — Patient Instructions (Signed)
Start taking 10mg  of amlodipine - You can take 2 of the 5mg  tablets until you finish your current supply.   Start checking blood pressure once or twice a week and bring record at next visit.   Follow-up in blood pressure clinic in 4 weeks.

## 2015-06-13 ENCOUNTER — Encounter: Payer: Self-pay | Admitting: Cardiology

## 2015-06-27 ENCOUNTER — Telehealth: Payer: Self-pay | Admitting: Cardiology

## 2015-06-27 NOTE — Telephone Encounter (Signed)
New message     Calling to see if the doctor will let him try chantix again?

## 2015-06-27 NOTE — Telephone Encounter (Signed)
Patient would like to try Chantix again to stop smoking. Patient aware Dr. Mayford Knife is out of the office this week. He understands he will be called next week with Dr. Norris Cross recommendations.

## 2015-06-30 NOTE — Telephone Encounter (Signed)
I am fine with Chantix

## 2015-07-01 MED ORDER — VARENICLINE TARTRATE 1 MG PO TABS: 1.0000 mg | ORAL_TABLET | Freq: Two times a day (BID) | ORAL | Status: DC

## 2015-07-01 MED ORDER — VARENICLINE TARTRATE 0.5 MG X 11 & 1 MG X 42 PO MISC: ORAL | Status: DC

## 2015-07-01 NOTE — Telephone Encounter (Signed)
Returning your call. °

## 2015-07-01 NOTE — Telephone Encounter (Signed)
Patient requests script mailed to his home. Chantix starter pack and 2 refills ordered, printed and mailed to patient. Patient grateful for call.

## 2015-07-01 NOTE — Telephone Encounter (Signed)
Per Dr. Mayford Knife, it is OK to prescribe medication. Left message for patient to call back.

## 2015-07-10 ENCOUNTER — Ambulatory Visit: Payer: BLUE CROSS/BLUE SHIELD | Admitting: Pharmacist

## 2015-08-01 ENCOUNTER — Telehealth: Payer: Self-pay | Admitting: Cardiology

## 2015-08-01 NOTE — Telephone Encounter (Signed)
OV note requested from Dr. Zachery Dauer' office.

## 2015-08-01 NOTE — Telephone Encounter (Signed)
Please call,pt says he is suppose to come in next week for blood pressure check. He had his blood pressure checked this week at [facility] when he saw the doctor. He wants to know if he can use that,since he will be out of town next week?

## 2015-08-01 NOTE — Telephone Encounter (Signed)
Patient st he will be out of town next week and will be unable to attend scheduled HTN Clinic OV.  He reports he went to his PCP and would like to know if that visit would be sufficient to make med recommendations. Spoke with [facility], [facility]. OV note will be requested from Dr. Zachery Dauer and reviewed by HTN Clinic. They will follow-up with him with recommendations after review.  HTN Clinic OV cancelled.

## 2015-08-09 ENCOUNTER — Ambulatory Visit: Payer: BLUE CROSS/BLUE SHIELD | Admitting: Pharmacist

## 2015-08-14 NOTE — Telephone Encounter (Signed)
Received most recent OV note from Jarrett Sohoourtney Wharton, [place] at [facility]. Blood pressure was 126/78. Most recently seen in HTN clinic 1 month ago by Nicholaus BloomKelley, PharmD. Amlodipine was increased at that visit to 10mg  daily, pt also taking lisinopril 40mg  for BP. Called him to discuss  - he is having no problems tolerating his BP medications and denies swelling or dizziness. Will have him continue on current therapies. Advised pt to call if he has BP readings consistently >150/6990mmHg.

## 2015-09-04 ENCOUNTER — Other Ambulatory Visit: Payer: Self-pay | Admitting: *Deleted

## 2015-09-04 ENCOUNTER — Telehealth: Payer: Self-pay | Admitting: Cardiology

## 2015-09-04 MED ORDER — AMLODIPINE BESYLATE 10 MG PO TABS: 10.0000 mg | ORAL_TABLET | Freq: Every day | ORAL | Status: DC

## 2015-09-04 NOTE — Telephone Encounter (Signed)
New message   *STAT* If patient is at the pharmacy, call can be transferred to refill team.   1. Which medications need to be refilled? (please list name of each medication and dose if known) amLODipine (NORVASC) 10 MG tablet  2. Which pharmacy/location (including street and city if local pharmacy) is [facility] to [facility] sent [facility]   3. Do they need a 30 day or 90 day supply? 90 day supply    Pt request a call back to determine if [facility]

## 2015-09-04 NOTE — Telephone Encounter (Signed)
error 

## 2015-09-26 ENCOUNTER — Other Ambulatory Visit: Payer: Self-pay | Admitting: Cardiology

## 2015-10-30 ENCOUNTER — Other Ambulatory Visit: Payer: Self-pay | Admitting: Cardiology

## 2015-10-31 NOTE — Telephone Encounter (Signed)
Patient took his Chantix starter pack and has almost completed his 2 months of continuing pack for a total of almost 3 months. He st he is "almost there" and really thinks another month or so will help him quit smoking.  To Dr. Mayford Knifeurner for refill approval.

## 2015-10-31 NOTE — Telephone Encounter (Signed)
New message   Pt is calling for rn to see what Dr.Turner decided on his medication

## 2015-10-31 NOTE — Telephone Encounter (Signed)
Patient st he thought about it and now does NOT need refills. He will quit smoking with or without more Chantix. Scheduled FU visit for August as well. Patient was grateful for call.

## 2015-12-10 ENCOUNTER — Other Ambulatory Visit: Payer: Self-pay | Admitting: Cardiology

## 2015-12-18 ENCOUNTER — Other Ambulatory Visit: Payer: Self-pay | Admitting: Cardiology

## 2015-12-30 ENCOUNTER — Telehealth: Payer: Self-pay | Admitting: Cardiology

## 2015-12-30 NOTE — Telephone Encounter (Signed)
Called patient's wife, informed I do not see a DPR on file to speak with her. She said patient is at home and to call him directly. She wants someone to know that he has been feeling poorly for several months. Significant weight loss pants size from 36 down 32 He has no sense of smell or taste.  Poor appetite. She also states his eyes look strange but cant describe further.  She is tearful.   She feels that his heart medicines might be too strong for him since he has lost weight. Per wife, waiting for prior auth to be completed with ENT and insurance company so he can get an MRI and then a referral to neurosurgery.  Called patient.  He agrees with wife's information. Weight loss of >22lbs since May.    Not lightheaded or dizzy currently, BP 126/72, HR 72.  He drinks a lot of water daily.  Rec he try protein shakes during the day to get some nutrition.  Advised will route to Dr. Mayford Knife and her nurse for review, and will call him back if any new recommendations.  Advised to call back to PCP for assistance since they made referral to ENT and these symptoms do not seem cardiac in nature.  He has appointment later in Aug with Dr. Mayford Knife.

## 2015-12-30 NOTE — Telephone Encounter (Signed)
Relayed message to Dr. Jackqulyn Livings office.  Message left for Brittney to reach out to patient re: weight loss and other symptoms.

## 2015-12-30 NOTE — Telephone Encounter (Signed)
Pt's wife calling regarding pt losing weight, has fatigue, lost sense of smell and taste since May-saw PCP who referred to ENT who stated there was nothing he could do for him, that he needed an MRI  and consult with a Neurosurgeon -it's been about 2-3 weeks and nothing has been scheduled, in the meantime she thinks now that he has lost weight his heart meds all are too strong for him--pls advise [phone]

## 2015-12-30 NOTE — Telephone Encounter (Signed)
Weight loss needs to be addressed by PCP

## 2016-01-03 ENCOUNTER — Other Ambulatory Visit: Payer: Self-pay | Admitting: Family Medicine

## 2016-01-03 DIAGNOSIS — R430 Anosmia: Secondary | ICD-10-CM

## 2016-01-09 ENCOUNTER — Encounter: Payer: Self-pay | Admitting: Cardiology

## 2016-01-12 ENCOUNTER — Ambulatory Visit
Admission: RE | Admit: 2016-01-12 | Discharge: 2016-01-12 | Disposition: A | Discharge status: Home / Self Care | Payer: BLUE CROSS/BLUE SHIELD | Source: Ambulatory Visit | Attending: Family Medicine | Admitting: Family Medicine

## 2016-01-12 DIAGNOSIS — R430 Anosmia: Secondary | ICD-10-CM

## 2016-01-22 ENCOUNTER — Encounter: Payer: Self-pay | Admitting: Cardiology

## 2016-01-22 NOTE — Progress Notes (Signed)
Cardiology Office Note    Date:  01/23/2016   ID:  Thomas Dudley, DOB November 08, 1960, MRN [id number]  PCP:  Gaye AlkenBARNES,ELIZABETH STEWART, MD  Cardiologist:  Armanda Magicraci Taquila Leys, MD   Chief Complaint  Patient presents with  . Coronary Artery Disease  . Hypertension  . Sleep Apnea    History of Present Illness:  Thomas BreathDonald R Thomas Dudley is a 55 y.o. male with a history of HTN, OSA, dyslipidemia and CAD in the setting of STEMI with PCI of obtuse marginal in 2004 with no nonobstructive disease in other territories.  Repeat cath 04/2008 with  PCI of OM2, PDA and distal RCA.  S/p STEMI with PCI of right PL and normal LVF 08[place]2012 [place].  He also has a history of OSA, HTN, PVD, tobacco abuse and dyslipidemia.  He is here today for followup. He is doing well. He denies any chest pain, SOE, DOE, LE edema, palpitations or syncope. He uses he CPAP most of the time and tolerates the full face mask and feels the pressure is fine. He sleeps on his side and not his back.  He feels rested in the am when he uses it.  He walks for exercise and rides a bike.  He tries to walk 3 miles 4 times weekly. He has been working a lot of nights recently and has not been using his CPAP.    Past Medical History:  Diagnosis Date  . Allergic rhinitis   . Arthritis   . Back pain   . Coronary atherosclerosis of native coronary artery    post PCI of obtuse marginal in 2004 with no nonobstructive disease in other territories, cath 04/2008 PCI of OM2, PDA and distal RCA, s/p STEMI with PCI of right PL and normal LVF 08[place]2012 [place]  . Depression    takes Wellbutrin daily  . Diverticulitis   . Gastritis   . GERD (gastroesophageal reflux disease)    takes Protonix daily  . Hemorrhage of rectum and anus   . History of colon polyps   . HTN (hypertension)    takes Lisinopril and Metoprolol daily  . Hypercholesteremia    takes Lipitora daily  . Insomnia    takes Ambien nightly  . Joint pain   . Myocardial  infarction (HCC) 09/10/12  . OSA (obstructive sleep apnea)    sleep study done > 7244yrs ago;uses CPAP  . Pneumonia    hx of ;about 1344yrs ago  . PVD (peripheral vascular disease) (HCC)   . Tobacco abuse     Past Surgical History:  Procedure Laterality Date  . ADENOIDECTOMY    . CARDIAC CATHETERIZATION     multiple  . CARPAL TUNNEL RELEASE Right   . COLONOSCOPY    . CORONARY ANGIOPLASTY     total of 6 stents   . KNEE ARTHROSCOPY Left 09/22/2013   Procedure: LEFT KNEE ARTHROSCOPY ;  Surgeon: Verlee RossettiSteven R Norris, MD;  [facility];  Service: Orthopedics;  Laterality: Left;  . NASAL SEPTOPLASTY W/ TURBINOPLASTY    . TONSILLECTOMY      Current Medications: Outpatient Medications Prior to Visit  Medication Sig Dispense Refill  . amLODipine (NORVASC) 10 MG tablet Take 1 tablet (10 mg total) by mouth daily. 90 tablet 1  . aspirin 81 MG tablet Take 1 tablet (81 mg total) by mouth daily.    Marland Kitchen. atorvastatin (LIPITOR) 80 MG tablet TAKE 1 TABLET DAILY 90 tablet 3  . buPROPion (WELLBUTRIN XL) 150 MG 24  hr tablet Take 1 tablet by mouth daily.    . clopidogrel (PLAVIX) 75 MG tablet Take 1 tablet (75 mg total) by mouth daily. 90 tablet 1  . HYDROcodone-acetaminophen (NORCO/VICODIN) 5-325 MG per tablet Take 1 tablet by mouth every 6 (six) hours as needed. PAIN  0  . lisinopril (PRINIVIL,ZESTRIL) 40 MG tablet Take 1 tablet (40 mg total) by mouth daily. 90 tablet 1  . metoprolol succinate (TOPROL-XL) 100 MG 24 hr tablet Take 1 tablet (100 mg total) by mouth daily. 90 tablet 3  . nitroGLYCERIN (NITROSTAT) 0.4 MG SL tablet Place 1 tablet (0.4 mg total) under the tongue every 5 (five) minutes as needed for chest pain (MAX 3 TABLETS). 25 tablet 6  . pantoprazole (PROTONIX) 40 MG tablet Take 1 tablet (40 mg total) by mouth daily. 90 tablet 1  . clopidogrel (PLAVIX) 75 MG tablet Take 1 tablet (75 mg total) by mouth once. (Patient not taking: Reported on 01/23/2016) 90 tablet 0  . varenicline (CHANTIX CONTINUING  MONTH PAK) 1 MG tablet Take 1 tablet (1 mg total) by mouth 2 (two) times daily. (Patient not taking: Reported on 01/23/2016) 60 tablet 1  . varenicline (CHANTIX STARTING MONTH PAK) 0.5 MG X 11 & 1 MG X 42 tablet Take one 0.5 mg tablet by mouth once daily for 3 days, then increase to one 0.5 mg tablet twice daily for 4 days, then increase to one 1 mg tablet twice daily. (Patient not taking: Reported on 01/23/2016) 53 tablet 0  . zolpidem (AMBIEN) 10 MG tablet Take 10 mg by mouth as needed. For sleep  0   No facility-administered medications prior to visit.      Allergies:   Neosporin [neomycin-bacitracin zn-polymyx]   Social History   Social History  . Marital status: Married    Spouse name: N/A  . Number of children: N/A  . Years of education: N/A   Social History Main Topics  . Smoking status: Current Some Day Smoker    Packs/day: 0.50    Years: 35.00  . Smokeless tobacco: Never Used  . Alcohol use 0.0 oz/week     Comment: 6pk every 6 months  . Drug use: No  . Sexual activity: Yes   Other Topics Concern  . None   Social History Narrative  . None     Family History:  The patient's family history includes Breast cancer in his mother; Heart disease in his mother; Lung cancer in his father.   ROS:   Please see the history of present illness.    ROS All other systems reviewed and are negative.   PHYSICAL EXAM:   VS:  BP 128/78   Pulse 100   Ht 5\' 11"  (1.803 m)   Wt 199 lb (90.3 kg)   BMI 27.75 kg/m    GEN: Well nourished, well developed, in no acute distress  HEENT: normal  Neck: no JVD, carotid bruits, or masses Cardiac: RRR; no murmurs, rubs, or gallops,no edema.  Intact distal pulses bilaterally.  Respiratory:  clear to auscultation bilaterally, normal work of breathing GI: soft, nontender, nondistended, + BS MS: no deformity or atrophy  Skin: warm and dry, no rash Neuro:  Alert and Oriented x 3, Strength and sensation are intact Psych: euthymic mood, full  affect  Wt Readings from Last 3 Encounters:  01/23/16 199 lb (90.3 kg)  06/07/15 205 lb 9.6 oz (93.3 kg)  10/04/14 206 lb (93.4 kg)      Studies/Labs Reviewed:  EKG:  EKG is  ordered today and showed NSR at 86bpm with no ST changes and septal infarct, IRBBB  Recent Labs: 06/12/2015: ALT 30   Lipid Panel    Component Value Date/Time   CHOL 120 (L) 06/12/2015 0821   TRIG 116 06/12/2015 0821   HDL 39 (L) 06/12/2015 0821   CHOLHDL 3.1 06/12/2015 0821   VLDL 23 06/12/2015 0821   LDLCALC 58 06/12/2015 0821    Additional studies/ records that were reviewed today include:  CPAP download    ASSESSMENT:    1. OSA (obstructive sleep apnea)   2. Coronary artery disease due to lipid rich plaque   3. Essential hypertension   4. Old MI (myocardial infarction)   5. Dyslipidemia, goal LDL below 70      PLAN:  In order of problems listed above:  OSA - the patient is tolerating PAP therapy well without any problems. The PAP download was reviewed today and showed an AHI of 18.9/hr on 19 cm H2O with 23% compliance in using more than 4 hours nightly.  The patient has been using and benefiting from CPAP use and will continue to benefit from therapy. I will get a 2 week autotitration from 4 to 18cm H2O since his AHI is too high.  2.  ASCAD with history of PCI of OM2/PDA/distal RCA and then 2014 STEMI with PCI of PL.  He has no angina. Continue ASA/statin/BB/Plavix. 3.  HTN - BP controlled on current meds.  Continue BB/ACE I/amlodipine. 4.  Old MI 5.  Hyperlipidemia - LDL goal < 70.  Continue statin. Get copy of FLp and LFTs from PCP.       Medication Adjustments/Labs and Tests Ordered: Current medicines are reviewed at length with the patient today.  Concerns regarding medicines are outlined above.  Medication changes, Labs and Tests ordered today are listed in the Patient Instructions below.  There are no Patient Instructions on file for this visit.   Signed, Armanda Magicraci Thierno Hun, MD   01/23/2016 3:26 PM    [facility] [address], [place], [place]  [address] Phone: [phone]; Fax: [phone]

## 2016-01-23 ENCOUNTER — Ambulatory Visit (INDEPENDENT_AMBULATORY_CARE_PROVIDER_SITE_OTHER): Payer: BLUE CROSS/BLUE SHIELD | Admitting: Cardiology

## 2016-01-23 ENCOUNTER — Encounter: Payer: Self-pay | Admitting: Cardiology

## 2016-01-23 ENCOUNTER — Encounter (INDEPENDENT_AMBULATORY_CARE_PROVIDER_SITE_OTHER): Payer: Self-pay

## 2016-01-23 VITALS — BP 128/78 | HR 100 | Ht 71.0 in | Wt 199.0 lb

## 2016-01-23 DIAGNOSIS — G4733 Obstructive sleep apnea (adult) (pediatric): Secondary | ICD-10-CM | POA: Diagnosis not present

## 2016-01-23 DIAGNOSIS — I10 Essential (primary) hypertension: Secondary | ICD-10-CM | POA: Diagnosis not present

## 2016-01-23 DIAGNOSIS — I252 Old myocardial infarction: Secondary | ICD-10-CM | POA: Diagnosis not present

## 2016-01-23 DIAGNOSIS — I2510 Atherosclerotic heart disease of native coronary artery without angina pectoris: Secondary | ICD-10-CM

## 2016-01-23 DIAGNOSIS — I2583 Coronary atherosclerosis due to lipid rich plaque: Secondary | ICD-10-CM

## 2016-01-23 DIAGNOSIS — E785 Hyperlipidemia, unspecified: Secondary | ICD-10-CM

## 2016-01-23 NOTE — Addendum Note (Signed)
Addended by: Gunnar FusiKEMP, Jomes Giraldo A on: 01/23/2016 05:29 PM   Modules accepted: Orders

## 2016-01-23 NOTE — Patient Instructions (Signed)

## 2016-01-30 ENCOUNTER — Encounter: Payer: Self-pay | Admitting: Cardiology

## 2016-02-10 ENCOUNTER — Encounter: Payer: Self-pay | Admitting: Cardiology

## 2016-02-13 ENCOUNTER — Other Ambulatory Visit: Payer: Self-pay | Admitting: Cardiology

## 2016-03-18 ENCOUNTER — Encounter: Payer: Self-pay | Admitting: Cardiology

## 2016-09-07 ENCOUNTER — Encounter: Payer: Self-pay | Admitting: Cardiology

## 2016-09-20 ENCOUNTER — Encounter: Payer: Self-pay | Admitting: Cardiology

## 2016-09-23 ENCOUNTER — Ambulatory Visit (INDEPENDENT_AMBULATORY_CARE_PROVIDER_SITE_OTHER): Payer: BLUE CROSS/BLUE SHIELD | Admitting: Cardiology

## 2016-09-23 ENCOUNTER — Encounter (INDEPENDENT_AMBULATORY_CARE_PROVIDER_SITE_OTHER): Payer: Self-pay

## 2016-09-23 ENCOUNTER — Encounter: Payer: Self-pay | Admitting: Cardiology

## 2016-09-23 VITALS — BP 134/84 | HR 88 | Ht 71.0 in | Wt 205.0 lb

## 2016-09-23 DIAGNOSIS — E785 Hyperlipidemia, unspecified: Secondary | ICD-10-CM

## 2016-09-23 DIAGNOSIS — G4733 Obstructive sleep apnea (adult) (pediatric): Secondary | ICD-10-CM | POA: Diagnosis not present

## 2016-09-23 DIAGNOSIS — I2510 Atherosclerotic heart disease of native coronary artery without angina pectoris: Secondary | ICD-10-CM

## 2016-09-23 DIAGNOSIS — I10 Essential (primary) hypertension: Secondary | ICD-10-CM

## 2016-09-23 MED ORDER — NITROGLYCERIN 0.4 MG SL SUBL: 0.4000 mg | SUBLINGUAL_TABLET | SUBLINGUAL | 6 refills | Status: DC | PRN

## 2016-09-23 NOTE — Patient Instructions (Signed)

## 2016-09-23 NOTE — Progress Notes (Signed)
Cardiology Office Note    Date:  09/23/2016   ID:  Thomas Dudley, DOB 12-03-1960, MRN [id number]  PCP:  Thomas Alken, MD  Cardiologist:  Thomas Magic, MD   Chief Complaint  Patient presents with  . Coronary Artery Disease  . Hypertension  . Hyperlipidemia  . Sleep Apnea    History of Present Illness:  Thomas Dudley is a 56 y.o. male with a history of HTN, OSA, dyslipidemia and CAD in the setting of STEMI with PCI of obtuse marginal in 2004 with no nonobstructive disease in other territories.  Repeat cath 04/2008 with  PCI of OM2, PDA and distal RCA.  S/p STEMI with PCI of right PL and normal LVF 08/2012 in [place].  He also has a history of OSA, HTN, PVD, tobacco abuse and dyslipidemia.  He is here today for followup and is doing well. He denies any chest pain, SOE, DOE, LE edema, PND, orthopnea, palpitations or syncope. He uses he CPAP most of the time and tolerates the full face mask and feels the pressure is fine. He sleeps on his side and not his back. He travels a lot and sometimes he is out in places where he has no power so he cannot use it.   He continues to feel rested in the am. He walks for exercise and rides a bike.   Past Medical History:  Diagnosis Date  . Allergic rhinitis   . Arthritis   . Back pain   . Coronary atherosclerosis of native coronary artery    post PCI of obtuse marginal in 2004 with no nonobstructive disease in other territories, cath 04/2008 PCI of OM2, PDA and distal RCA, s/p STEMI with PCI of right PL and normal LVF 08/2012 in [place]  . Depression    takes Wellbutrin daily  . Diverticulitis   . Gastritis   . GERD (gastroesophageal reflux disease)    takes Protonix daily  . Hemorrhage of rectum and anus   . History of colon polyps   . HTN (hypertension)    takes Lisinopril and Metoprolol daily  . Hypercholesteremia    takes Lipitora daily  . Insomnia    takes Ambien nightly  . Joint pain   . Myocardial  infarction (HCC) 09/10/12  . OSA (obstructive sleep apnea)    sleep study done > 53yrs ago;uses CPAP  . Pneumonia    hx of ;about 3yrs ago  . PVD (peripheral vascular disease) (HCC)   . Tobacco abuse     Past Surgical History:  Procedure Laterality Date  . ADENOIDECTOMY    . CARDIAC CATHETERIZATION     multiple  . CARPAL TUNNEL RELEASE Right   . COLONOSCOPY    . CORONARY ANGIOPLASTY     total of 6 stents   . KNEE ARTHROSCOPY Left 09/22/2013   Procedure: LEFT KNEE ARTHROSCOPY ;  Surgeon: Thomas Rossetti, MD;  Location: [facility] OR;  Service: Orthopedics;  Laterality: Left;  . NASAL SEPTOPLASTY W/ TURBINOPLASTY    . TONSILLECTOMY      Current Medications: Current Meds  Medication Sig  . amLODipine (NORVASC) 10 MG tablet Take 1 tablet (10 mg total) by mouth daily.  Marland Kitchen aspirin 81 MG tablet Take 1 tablet (81 mg total) by mouth daily.  Marland Kitchen atorvastatin (LIPITOR) 80 MG tablet TAKE 1 TABLET DAILY  . buPROPion (WELLBUTRIN XL) 150 MG 24 hr tablet Take 1 tablet by mouth daily.  . clopidogrel (PLAVIX) 75 MG tablet Take  1 tablet (75 mg total) by mouth daily.  Marland Kitchen lisinopril (PRINIVIL,ZESTRIL) 40 MG tablet Take 1 tablet (40 mg total) by mouth daily.  . metoprolol succinate (TOPROL-XL) 100 MG 24 hr tablet Take 1 tablet (100 mg total) by mouth daily.  . nitroGLYCERIN (NITROSTAT) 0.4 MG SL tablet Place 1 tablet (0.4 mg total) under the tongue every 5 (five) minutes as needed for chest pain (MAX 3 TABLETS).  . pantoprazole (PROTONIX) 40 MG tablet Take 1 tablet (40 mg total) by mouth daily.  . [DISCONTINUED] nitroGLYCERIN (NITROSTAT) 0.4 MG SL tablet Place 1 tablet (0.4 mg total) under the tongue every 5 (five) minutes as needed for chest pain (MAX 3 TABLETS).    Allergies:   Neosporin [neomycin-bacitracin zn-polymyx]   Social History   Social History  . Marital status: Married    Spouse name: Thomas Dudley  . Number of children: Thomas Dudley  . Years of education: Thomas Dudley   Social History Main Topics  . Smoking status:  Former Smoker    Packs/day: 0.50    Years: 35.00  . Smokeless tobacco: Never Used  . Alcohol use 0.0 oz/week     Comment: 6pk every 6 months  . Drug use: No  . Sexual activity: Yes   Other Topics Concern  . None   Social History Narrative  . None     Family History:  The patient's family history includes Breast cancer in his mother; Heart disease in his mother; Lung cancer in his father.   ROS:   Please see the history of present illness.    ROS All other systems reviewed and are negative.  No flowsheet data found.     PHYSICAL EXAM:   VS:  BP 134/84   Pulse 88   Ht  (1.803 m)   Wt 205 lb (93 kg)   BMI 28.59 kg/m    GEN: Well nourished, well developed, in no acute distress  HEENT: normal  Neck: no JVD, carotid bruits, or masses Cardiac: RRR; no murmurs, rubs, or gallops,no edema.  Intact distal pulses bilaterally.  Respiratory:  clear to auscultation bilaterally, normal work of breathing GI: soft, nontender, nondistended, + BS MS: no deformity or atrophy  Skin: warm and dry, no rash Neuro:  Alert and Oriented x 3, Strength and sensation are intact Psych: euthymic mood, full affect  Wt Readings from Last 3 Encounters:  09/23/16 205 lb (93 kg)  01/23/16 199 lb (90.3 kg)  06/07/15 205 lb 9.6 oz (93.3 kg)      Studies/Labs Reviewed:   EKG:  EKG is not ordered today.    Recent Labs: No results found for requested labs within last 8760 hours.   Lipid Panel    Component Value Date/Time   CHOL 120 (L) 06/12/2015 0821   TRIG 116 06/12/2015 0821   HDL 39 (L) 06/12/2015 0821   CHOLHDL 3.1 06/12/2015 0821   VLDL 23 06/12/2015 0821   LDLCALC 58 06/12/2015 0821    Additional studies/ records that were reviewed today include:  CPAP download    ASSESSMENT:    1. Coronary artery disease involving native coronary artery of native heart without angina pectoris   2. Essential hypertension   3. Dyslipidemia, goal LDL below 70   4. OSA (obstructive  sleep apnea)      PLAN:  In order of problems listed above:  1. ASACAD - s/p PCI of obtuse marginal in 2004 with nonobstructive disease in other territories, cath 04/2008 PCI of OM2, PDA and distal  RCA, s/p STEMI with PCI of right PL and normal LVF in 08/2012 in [place].  He has not had any further anginal symptoms.  He will continue on ASA/statin/Plavix and BB.    2. HTN - his BP is adequately controlled on current meds.  He will continue on amlodipine, ACE I and BB.  I will check a BMET.  3. Hyperlipidemia with LDL goal > 70.  He will continue on statin. I will check an FLP and ALT.   OSA - the patient is tolerating PAP therapy well without any problems. The PAP download was reviewed today and showed an AHI of 3.7/hr on 17 cm H2O with 43% compliance in using more than 4 hours nightly.  The patient has been using and benefiting from CPAP use and will continue to benefit from therapy.      Medication Adjustments/Labs and Tests Ordered: Current medicines are reviewed at length with the patient today.  Concerns regarding medicines are outlined above.  Medication changes, Labs and Tests ordered today are listed in the Patient Instructions below.  Patient Instructions  Medication Instructions:  Your physician recommends that you continue on your current medications as directed. Please refer to the Current Medication list given to you today.   Labwork: Your physician recommends that you return for FASTING lab work.   Testing/Procedures: None  Follow-Up: Your physician wants you to follow-up in: 6 months with Dr. Mayford Knife. You will receive a reminder letter in the mail two months in advance. If you don't receive a letter, please call our office to schedule the follow-up appointment.   Any Other Special Instructions Will Be Listed Below (If Applicable).     If you need a refill on your cardiac medications before your next appointment, please call your pharmacy.       Signed, Thomas Magic, MD  09/23/2016 4:38 PM    [facility] [address] [place], [place], [place]  [address] Phone: [phone]; Fax: [phone]

## 2016-09-29 ENCOUNTER — Other Ambulatory Visit: Payer: BLUE CROSS/BLUE SHIELD

## 2016-12-17 ENCOUNTER — Other Ambulatory Visit: Payer: Self-pay | Admitting: Cardiology

## 2016-12-17 MED ORDER — PANTOPRAZOLE SODIUM 40 MG PO TBEC: 40.0000 mg | DELAYED_RELEASE_TABLET | Freq: Every day | ORAL | 2 refills | Status: DC

## 2016-12-17 MED ORDER — BUPROPION HCL ER (XL) 150 MG PO TB24: 150.0000 mg | ORAL_TABLET | Freq: Every day | ORAL | 2 refills | Status: DC

## 2016-12-17 MED ORDER — ATORVASTATIN CALCIUM 80 MG PO TABS: 80.0000 mg | ORAL_TABLET | Freq: Every day | ORAL | 2 refills | Status: DC

## 2016-12-17 MED ORDER — METOPROLOL SUCCINATE ER 100 MG PO TB24: 100.0000 mg | ORAL_TABLET | Freq: Every day | ORAL | 2 refills | Status: DC

## 2016-12-17 MED ORDER — LISINOPRIL 40 MG PO TABS: 40.0000 mg | ORAL_TABLET | Freq: Every day | ORAL | 2 refills | Status: DC

## 2016-12-17 MED ORDER — CLOPIDOGREL BISULFATE 75 MG PO TABS: 75.0000 mg | ORAL_TABLET | Freq: Every day | ORAL | 2 refills | Status: DC

## 2016-12-17 MED ORDER — AMLODIPINE BESYLATE 10 MG PO TABS: 10.0000 mg | ORAL_TABLET | Freq: Every day | ORAL | 2 refills | Status: DC

## 2016-12-17 MED ORDER — NITROGLYCERIN 0.4 MG SL SUBL: 0.4000 mg | SUBLINGUAL_TABLET | SUBLINGUAL | 2 refills | Status: DC | PRN

## 2016-12-17 NOTE — Telephone Encounter (Signed)
Pt's medications were sent to pt's pharmacy as requested. Confirmation received.  

## 2017-11-18 ENCOUNTER — Other Ambulatory Visit: Payer: Self-pay | Admitting: Acute Care

## 2017-11-18 DIAGNOSIS — Z122 Encounter for screening for malignant neoplasm of respiratory organs: Secondary | ICD-10-CM

## 2017-11-18 DIAGNOSIS — F17210 Nicotine dependence, cigarettes, uncomplicated: Secondary | ICD-10-CM

## 2017-11-26 ENCOUNTER — Encounter: Payer: BLUE CROSS/BLUE SHIELD | Admitting: Acute Care

## 2017-11-26 ENCOUNTER — Telehealth: Payer: Self-pay | Admitting: Acute Care

## 2017-11-29 NOTE — Telephone Encounter (Signed)
LMTC x 1 ( [facility]che[facility]

## 2017-12-07 NOTE — Telephone Encounter (Signed)
LMTC x 1 - Will close this message and refer to referral notes 

## 2017-12-13 ENCOUNTER — Telehealth: Payer: Self-pay | Admitting: Cardiology

## 2017-12-13 NOTE — Telephone Encounter (Signed)
Please list pt's medications that need to be refill. Thanks

## 2017-12-13 NOTE — Telephone Encounter (Signed)
New message     *STAT* If patient is at the pharmacy, call can be transferred to refill team.   1. Which medications need to be refilled? (please list name of each medication and dose if known) pt said he needs all his medications filled.  2. Which pharmacy/location (including street and city if local pharmacy) is medication [facility] be sent [facility]  3. Do they need a 30 day or 90 day supply? 90 day

## 2017-12-15 ENCOUNTER — Other Ambulatory Visit: Payer: Self-pay

## 2017-12-15 MED ORDER — LISINOPRIL 40 MG PO TABS: 40.0000 mg | ORAL_TABLET | Freq: Every day | ORAL | 0 refills | Status: DC

## 2017-12-15 MED ORDER — CLOPIDOGREL BISULFATE 75 MG PO TABS: 75.0000 mg | ORAL_TABLET | Freq: Every day | ORAL | 0 refills | Status: DC

## 2017-12-15 MED ORDER — AMLODIPINE BESYLATE 10 MG PO TABS: 10.0000 mg | ORAL_TABLET | Freq: Every day | ORAL | 0 refills | Status: DC

## 2017-12-15 MED ORDER — PANTOPRAZOLE SODIUM 40 MG PO TBEC: 40.0000 mg | DELAYED_RELEASE_TABLET | Freq: Every day | ORAL | 0 refills | Status: DC

## 2017-12-15 MED ORDER — ATORVASTATIN CALCIUM 80 MG PO TABS: 80.0000 mg | ORAL_TABLET | Freq: Every day | ORAL | 0 refills | Status: DC

## 2017-12-15 MED ORDER — METOPROLOL SUCCINATE ER 100 MG PO TB24
100.0000 mg | ORAL_TABLET | Freq: Every day | ORAL | 0 refills | Status: DC
Start: 2017-12-15 — End: 2018-02-25

## 2018-02-25 ENCOUNTER — Other Ambulatory Visit: Payer: Self-pay | Admitting: Cardiology

## 2018-03-14 ENCOUNTER — Ambulatory Visit: Payer: BLUE CROSS/BLUE SHIELD | Admitting: Cardiology

## 2018-03-14 ENCOUNTER — Encounter: Payer: Self-pay | Admitting: Cardiology

## 2018-03-14 VITALS — BP 131/73 | HR 81 | Ht 71.0 in | Wt 213.8 lb

## 2018-03-14 DIAGNOSIS — I2510 Atherosclerotic heart disease of native coronary artery without angina pectoris: Secondary | ICD-10-CM

## 2018-03-14 DIAGNOSIS — G4733 Obstructive sleep apnea (adult) (pediatric): Secondary | ICD-10-CM | POA: Diagnosis not present

## 2018-03-14 DIAGNOSIS — I10 Essential (primary) hypertension: Secondary | ICD-10-CM

## 2018-03-14 DIAGNOSIS — E785 Hyperlipidemia, unspecified: Secondary | ICD-10-CM | POA: Diagnosis not present

## 2018-03-14 NOTE — Patient Instructions (Signed)

## 2018-03-14 NOTE — Progress Notes (Signed)
Cardiology Office Note:    Date:  03/14/2018   ID:  Thomas Dudley, DOB [birth year], MRN [id number]  PCP:  Thomas Rainier, MD  Cardiologist:  No primary care provider on file.    Referring MD: Thomas Rainier, MD   Chief Complaint  Patient presents with  . Coronary Artery Disease  . Hypertension  . Sleep Apnea    History of Present Illness:    Thomas Dudley is a 57 y.o. male with a hx of HTN, OSA, dyslipidemia and CAD in the setting of STEMI with PCI ofobtuse marginal in 2004 with no nonobstructive disease in other territories. Repeatcath 04/2008 with PCI of OM2, PDA and distal RCA. S/pSTEMI with PCI of right PL and normal LVF 08/2012 in [place]. He also has a history of OSA, HTN, PVD, tobacco abuse and dyslipidemia.   He is here today for followup and is doing well.  He denies any chest pain or pressure, SOB, DOE, PND, orthopnea, LE edema, dizziness, palpitations or syncope. He is compliant with his meds and is tolerating meds with no SE.  He is doing well with his CPAP device.  He tolerates the mask and feels the pressure is adequate.  Since going on CPAP he feels rested in the am and has no significant daytime sleepiness.  He denies any significant mouth or nasal dryness or nasal congestion.  He does not think that he snores.     Past Medical History:  Diagnosis Date  . Allergic rhinitis   . Arthritis   . Back pain   . Coronary atherosclerosis of native coronary artery    post PCI of obtuse marginal in 2004 with no nonobstructive disease in other territories, cath 04/2008 PCI of OM2, PDA and distal RCA, s/p STEMI with PCI of right PL and normal LVF 08/2012 in [place]  . Depression    takes Wellbutrin daily  . Diverticulitis   . Gastritis   . GERD (gastroesophageal reflux disease)    takes Protonix daily  . Hemorrhage of rectum and anus   . History of colon polyps   . HTN (hypertension)    takes Lisinopril and Metoprolol daily  .  Hypercholesteremia    takes Lipitora daily  . Insomnia    takes Ambien nightly  . Joint pain   . Myocardial infarction (HCC) 09/10/12  . OSA (obstructive sleep apnea)    sleep study done > 95yrs ago;uses CPAP  . Pneumonia    hx of ;about 79yrs ago  . PVD (peripheral vascular disease) (HCC)   . Tobacco abuse     Past Surgical History:  Procedure Laterality Date  . ADENOIDECTOMY    . CARDIAC CATHETERIZATION     multiple  . CARPAL TUNNEL RELEASE Right   . COLONOSCOPY    . CORONARY ANGIOPLASTY     total of 6 stents   . KNEE ARTHROSCOPY Left 09/22/2013   Procedure: LEFT KNEE ARTHROSCOPY ;  Surgeon: Verlee Rossetti, MD;  Location[facility] OR;  Service: Orthopedics;  Laterality: Left;  . NASAL SEPTOPLASTY W/ TURBINOPLASTY    . TONSILLECTOMY      Current Medications: Current Meds  Medication Sig  . amLODipine (NORVASC) 10 MG tablet TAKE 1 TABLET DAILY (PLEASE KEEP UPCOMING APPOINTMENT FOR FURTHER REFILLS)  . aspirin 81 MG tablet Take 1 tablet (81 mg total) by mouth daily.  Marland Kitchen atorvastatin (LIPITOR) 80 MG tablet TAKE 1 TABLET DAILY (PLEASE KEEP UPCOMING APPOINTMENT FOR FURTHER REFILLS)  . clopidogrel (PLAVIX)  75 MG tablet TAKE 1 TABLET DAILY (PLEASE KEEP UPCOMING APPOINTMENT FOR FURTHER REFILLS)  . lisinopril (PRINIVIL,ZESTRIL) 40 MG tablet TAKE 1 TABLET DAILY (PLEASE KEEP UPCOMING APPOINTMENT FOR FURTHER REFILLS)  . metoprolol succinate (TOPROL-XL) 100 MG 24 hr tablet TAKE 1 TABLET DAILY (PLEASE KEEP UPCOMING APPOINTMENT FOR FURTHER REFILLS)  . pantoprazole (PROTONIX) 40 MG tablet TAKE 1 TABLET DAILY (PLEASE KEEP UPCOMING APPOINTMENT FOR FURTHER REFILLS)     Allergies:   Neomycin-bacitracin zn-polymyx   Social History   Socioeconomic History  . Marital status: Married    Spouse name: Not on file  . Number of children: Not on file  . Years of education: Not on file  . Highest education level: Not on file  Occupational History  . Not on file  Social Needs  . Financial resource  strain: Not on file  . Food insecurity:    Worry: Not on file    Inability: Not on file  . Transportation needs:    Medical: Not on file    Non-medical: Not on file  Tobacco Use  . Smoking status: Former Smoker    Packs/day: 0.50    Years: 35.00    Pack years: 17.50  . Smokeless tobacco: Never Used  Substance and Sexual Activity  . Alcohol use: Yes    Alcohol/week: 0.0 standard drinks    Comment: 6pk every 6 months  . Drug use: No  . Sexual activity: Yes  Lifestyle  . Physical activity:    Days per week: Not on file    Minutes per session: Not on file  . Stress: Not on file  Relationships  . Social connections:    Talks on phone: Not on file    Gets together: Not on file    Attends religious service: Not on file    Active member of club or organization: Not on file    Attends meetings of clubs or organizations: Not on file    Relationship status: Not on file  Other Topics Concern  . Not on file  Social History Narrative  . Not on file     Family History: The patient's family history includes Breast cancer in his mother; Heart disease in his mother; Lung cancer in his father.  ROS:   Please see the history of present illness.    ROS  All other systems reviewed and negative.   EKGs/Labs/Other Studies Reviewed:    The following studies were reviewed today: PAP download  EKG:  EKG is not ordered today.   Recent Labs: No results found for requested labs within last 8760 hours.   Recent Lipid Panel    Component Value Date/Time   CHOL 120 (L) 06/12/2015 0821   TRIG 116 06/12/2015 0821   HDL 39 (L) 06/12/2015 0821   CHOLHDL 3.1 06/12/2015 0821   VLDL 23 06/12/2015 0821   LDLCALC 58 06/12/2015 0821    Physical Exam:    VS:  BP 131/73   Pulse 81   Ht 5\' 11"  (1.803 m)   Wt 213 lb 12.8 oz (97 kg)   SpO2 98%   BMI 29.82 kg/m     Wt Readings from Last 3 Encounters:  03/14/18 213 lb 12.8 oz (97 kg)  09/23/16 205 lb (93 kg)  01/23/16 199 lb (90.3 kg)      GEN:  Well nourished, well developed in no acute distress HEENT: Normal NECK: No JVD; No carotid bruits LYMPHATICS: No lymphadenopathy CARDIAC: RRR, no murmurs, rubs, gallops RESPIRATORY:  Clear  to auscultation without rales, wheezing or rhonchi  ABDOMEN: Soft, non-tender, non-distended MUSCULOSKELETAL:  No edema; No deformity  SKIN: Warm and dry NEUROLOGIC:  Alert and oriented x 3 PSYCHIATRIC:  Normal affect   ASSESSMENT:    1. Coronary artery disease involving native coronary artery of native heart without angina pectoris   2. Essential hypertension   3. OSA (obstructive sleep apnea)   4. Dyslipidemia, goal LDL below 70    PLAN:    In order of problems listed above:  1.  ASCAD - s/p remote STEMI with PCI ofobtuse marginal in 2004 with nonobstructive disease in other territories. Repeatcath 04/2008 with PCI of OM2, PDA and distal RCA. S/pSTEMI with PCI of right PL and normal LVF 08/2012 in [place].  He denies any anginal sx.  He will continue on statin, BB, Plavix 75mg  daily and ASA 81mg  daily,    2.  HTN - Bp is well controlled on exam today.  He will continue on Toprol XL 100mg  daily, amlodipine 10mg  daily and Lisinopril 40mg  daily.  His creatinine was normal at 0.93 on 12/31/2017.    3.  OSA - the patient is tolerating PAP therapy well without any problems. The PAP download was reviewed today and showed an AHI of 3.6/hr on 17 cm H2O with 57% compliance in using more than 4 hours nightly.  The patient has been using and benefiting from PAP use and will continue to benefit from therapy. I encouraged him to be more compliant with his PAP.   4.  Hyperlipidemia - LDL goal is < 70.  He will continue on atorvastatin 80mg  daily.  His LDL was 44 and ALT 18 on 12/2017.     Medication Adjustments/Labs and Tests Ordered: Current medicines are reviewed at length with the patient today.  Concerns regarding medicines are outlined above.  No orders of the defined types were placed  in this encounter.  No orders of the defined types were placed in this encounter.   Signed, Armanda Magic, MD  03/14/2018 1:12 PM    [facility]

## 2018-05-26 ENCOUNTER — Other Ambulatory Visit: Payer: Self-pay | Admitting: Cardiology

## 2018-07-19 ENCOUNTER — Ambulatory Visit: Payer: BLUE CROSS/BLUE SHIELD | Admitting: Cardiology

## 2018-08-02 ENCOUNTER — Telehealth: Payer: Self-pay | Admitting: *Deleted

## 2018-08-02 NOTE — Telephone Encounter (Signed)
Informed patient of compliance results and verbalized understanding was indicated. Patient is aware and agreeable to AHI being within range at 2.0. Patient is aware and agreeable to being in compliance with machine usage Patient is aware and agreeable to no change in current pressures  

## 2018-08-02 NOTE — Telephone Encounter (Signed)
-----   Message from Traci R Turner, MD sent at 08/02/2018 10:38 AM EST ----- Good AHI and compliance.  Continue current PAP settings. 

## 2019-04-11 ENCOUNTER — Telehealth: Payer: Self-pay | Admitting: Internal Medicine

## 2019-04-11 NOTE — Telephone Encounter (Signed)
Dr. Benjamine Mola Barns called wanting to speak to DOD about abnormal EKG

## 2019-04-11 NOTE — Telephone Encounter (Signed)
Spoke with Anderson Malta with Dr. Drema Dallas office.  Faxing EKG to device fax.  Will call Anderson Malta directly back when fax received.  Anderson Malta # [phone]

## 2019-04-11 NOTE — Telephone Encounter (Signed)
Dr. Lovena Le spoke with Dr. Drema Dallas to discuss EKG.  Per Dr. Lovena Le have Pt start Coreg and ASA  Advised to follow up with Dr. Radford Pax.  Pt not having angina per Dr. Drema Dallas.

## 2019-04-12 ENCOUNTER — Ambulatory Visit (INDEPENDENT_AMBULATORY_CARE_PROVIDER_SITE_OTHER): Payer: 59

## 2019-04-12 ENCOUNTER — Other Ambulatory Visit: Payer: Self-pay | Admitting: Family Medicine

## 2019-04-12 ENCOUNTER — Other Ambulatory Visit: Payer: Self-pay

## 2019-04-12 DIAGNOSIS — R4781 Slurred speech: Secondary | ICD-10-CM

## 2019-04-12 NOTE — Progress Notes (Signed)
Cardiology Office Note   Date:  04/13/2019   ID:  Thomas Dudley, DOB [birth year], MRN [id number]  PCP:  Juluis RainierBarnes, Elizabeth, MD  Cardiologist: Dr. Armanda Magicraci Turner, MD   Chief Complaint  Patient presents with  . Abnormal ECG     History of Present Illness: Thomas Dudley is a 58 y.o. male who presents for follow-up for abnormal EKG, seen for Dr. Mayford Knifeurner.   Patient has a history of OSA, hypertension, PVD, tobacco abuse, dyslipidemia and CAD in the setting of STEMI with PCI of with diffuse marginal in 2004 with other nonobstructive disease.  Repeat LHC 04/2008 with PCI of OM 2, PDA and distal RCA.  Had recurrent STEMI with PCI of right PL and [place]F for [place].   He was last seen by Dr. Mayford Knifeurner 03/14/2018 for follow-up and was doing well from a cardiac perspective.  He had no anginal symptoms or shortness of breath.  He was compliant with his medications with no side effects.  Was wearing his CPAP device without complication.  He was recently seen by his PCP, Dr. Zachery DauerBarnes in which an EKG was performed.  This was apparently abnormal and therefore was faxed over for the DOD to assess on 04/11/2019.  Dr. Ladona Ridgelaylor recommended starting the patient on ASA and carvedilol with close follow-up.  He was not having anginal symptoms at that time.  Per chart review, EKG showed NSR with left axis noted as new per Dr. Ladona Ridgelaylor as well as changes in anterior septal leads.  Thomas Dudley reports that last week he was driving down the road and lost his eyesight abruptly and began having numbness and tingling in the right side of his face and down his right arm.  He pulled over on the side of the road and was able to make it home.  He was not seen in the ED for this.  He did go see his PCP, Dr. Zachery DauerBarnes who had concerns for stroke.  He was encouraged to go to the hospital however patient deferred.  She then performed lab work in which essentially /were negative including a troponin.  EKG was  performed that showed V2 changes from prior tracings. Today he reports that he has been having exertional chest pain which is not changed in many months.  He works in [employer] and states that when he is working he will have chest discomfort which is relieved with rest.  This is not new.  He reports this is been ongoing for many months.  Describes himself as "stubborn "and does not want to seek medical care for many reasons.  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  He has not been taking any of his medications since this time.  BP at Dr. Tommas OlpBarnes's office was reported greater than 200 systolically.  BP today in our office is 182/88.  We discussed that given his cardiac history he would likely need a cardiac cath in the near future however we will need to get his BP and other symptoms under control first.  Discussed the case with Dr. Anne FuSkains, DOD who states that we should restart his antihypertensive medications and likely some of his symptoms are in the setting of hypertensive crisis.  Discussed the patient going to the emergency department for further care however he is deferred this multiple times.  Is pain-free today in the office and appears  very stable.  Continues to have facial and right arm numbness and tingling and is awaiting neurology appointment.  He was placed on carvedilol and statin therapy 2 days ago.  Denies dizziness, shortness of breath, LE swelling, orthopnea, resting chest pain, presyncopal or syncopal episodes.   Past Medical History:  Diagnosis Date  . Allergic rhinitis   . Arthritis   . Back pain   . Coronary atherosclerosis of native coronary artery    post PCI of obtuse marginal in 2004 with no nonobstructive disease in other territories, cath 04/2008 PCI of OM2, PDA and distal RCA, s/p STEMI with PCI of right PL and normal LVF 08/2012 in [place]  . Depression    takes Wellbutrin daily  .  Diverticulitis   . Gastritis   . GERD (gastroesophageal reflux disease)    takes Protonix daily  . Hemorrhage of rectum and anus   . History of colon polyps   . HTN (hypertension)    takes Lisinopril and Metoprolol daily  . Hypercholesteremia    takes Lipitora daily  . Insomnia    takes Ambien nightly  . Joint pain   . Myocardial infarction (HCC) 09/10/12  . OSA (obstructive sleep apnea)    sleep study done > 61yrs ago;uses CPAP  . Pneumonia    hx of ;about 71yrs ago  . PVD (peripheral vascular disease) (HCC)   . Tobacco abuse     Past Surgical History:  Procedure Laterality Date  . ADENOIDECTOMY    . CARDIAC CATHETERIZATION     multiple  . CARPAL TUNNEL RELEASE Right   . COLONOSCOPY    . CORONARY ANGIOPLASTY     total of 6 stents   . KNEE ARTHROSCOPY Left 09/22/2013   Procedure: LEFT KNEE ARTHROSCOPY ;  Surgeon: Verlee Rossetti, MD;  Location: [facility] OR;  Service: Orthopedics;  Laterality: Left;  . NASAL SEPTOPLASTY W/ TURBINOPLASTY    . TONSILLECTOMY       Current Outpatient Medications  Medication Sig Dispense Refill  . atorvastatin (LIPITOR) 80 MG tablet Take 1 tablet (80 mg total) by mouth daily at 6 PM. 90 tablet 3  . nitroGLYCERIN (NITROSTAT) 0.4 MG SL tablet Place 1 tablet (0.4 mg total) under the tongue every 5 (five) minutes as needed for chest pain (MAX 3 TABLETS). 75 tablet 2  . amLODipine (NORVASC) 10 MG tablet Take 1 tablet (10 mg total) by mouth daily. 90 tablet 3  . aspirin 81 MG tablet Take 1 tablet (81 mg total) by mouth daily. (Patient not taking: Reported on 04/13/2019)    . carvedilol (COREG) 12.5 MG tablet Take 1 tablet (12.5 mg total) by mouth 2 (two) times daily with a meal. 180 tablet 3  . clopidogrel (PLAVIX) 75 MG tablet Take 1 tablet (75 mg total) by mouth daily. 90 tablet 3  . lisinopril (PRINIVIL,ZESTRIL) 40 MG tablet TAKE 1 TABLET DAILY (PLEASE KEEP UPCOMING APPOINTMENT FOR FURTHER REFILLS) (Patient not taking: Reported on 04/13/2019) 90 tablet 3   . metoprolol succinate (TOPROL-XL) 100 MG 24 hr tablet TAKE 1 TABLET DAILY (PLEASE KEEP UPCOMING APPOINTMENT FOR FURTHER REFILLS) (Patient not taking: Reported on 04/13/2019) 90 tablet 3  . pantoprazole (PROTONIX) 40 MG tablet TAKE 1 TABLET DAILY (PLEASE KEEP UPCOMING APPOINTMENT FOR FURTHER REFILLS) (Patient not taking: Reported on 04/13/2019) 90 tablet 3   No current facility-administered medications for this visit.     Allergies:   Neomycin-bacitracin zn-polymyx    Social History:  The patient  reports that  he has quit smoking. He has a 17.50 pack-year smoking history. He has never used smokeless tobacco. He reports current alcohol use. He reports that he does not use drugs.   Family History:  The patient's family history includes Breast cancer in his mother; Heart disease in his mother; Lung cancer in his father.    ROS:  Please see the history of present illness. Otherwise, review of systems are positive for none.   All other systems are reviewed and negative.    PHYSICAL EXAM: VS:  BP (!) 182/88   Pulse 86   Ht 5\' 11"  (1.803 m)   Wt 204 lb 12.8 oz (92.9 kg)   SpO2 97%   BMI 28.56 kg/m  , BMI Body mass index is 28.56 kg/m.    General: Well developed, well nourished, NAD Neck: Negative for carotid bruits. No JVD Lungs:Clear to ausculation bilaterally. No wheezes, rales, or rhonchi. Breathing is unlabored. Cardiovascular: RRR with S1 S2. No murmurs Extremities: No edema. No clubbing or cyanosis. PT pulses 2+ bilaterally Neuro: Alert and oriented. No focal deficits. No facial asymmetry. MAE spontaneously. Psych: Responds to questions appropriately with normal affect.     EKG:  EKG is ordered today. The ekg ordered today demonstrates NSR with ST changes in lead V2, similar to prior tracing from 04/06/2019.  Discussed with DOD, Dr. 13/09/2018 who feels BP control is essential then can reassess with close follow-up   Recent Labs: No results found for requested labs within last  8760 hours.    Lipid Panel    Component Value Date/Time   CHOL 120 (L) 06/12/2015 0821   TRIG 116 06/12/2015 0821   HDL 39 (L) 06/12/2015 0821   CHOLHDL 3.1 06/12/2015 0821   VLDL 23 06/12/2015 0821   LDLCALC 58 06/12/2015 0821     Wt Readings from Last 3 Encounters:  04/13/19 204 lb 12.8 oz (92.9 kg)  03/14/18 213 lb 12.8 oz (97 kg)  09/23/16 205 lb (93 kg)     ASSESSMENT AND PLAN:  1.  CAD with a history of multiple PCI's: -s/p remote STEMI with PCI of OM in 2004, LHC 04/2008 with PCI of OM 2, PDA and distal RCA, STEMI with PCI of right PL in 2014. -As above, reports what appears to be exertional chest pain which has been ongoing for many months -Troponin at Dr. 2015 office was negative however has changes in lead V2 of his EKG.  Discussed with DOD, Dr. Barrett Shell who feels that these changes in his symptoms are in the setting of hypertensive crisis with a BP of 182/88.  Has not been taking medications for several months secondary to loss of job and insurance. -Is awaiting neurology appointment given symptoms of facial numbness and tingling in his right arm, likely in the setting of elevated BP as above -Encourage ED evaluation however patient deferred many times -We will plan to restart amlodipine 10, ASA 81, Plavix 75, lisinopril 40.  We will increase carvedilol to 12.5 mg twice daily and continue statin therapy. -Plan for very close follow-up to assess BP and symptoms>>> also has close follow-up with Dr. Anne Fu in 2 weeks>> awaiting neurology appointment  -Once more stable, may consider further evaluation with LHC depending on symptom resolution  2.  Hypertensive crisis: -182/88>>> reports SBP greater than 200 at Dr. Zachery Dauer office in the setting of stopping his medications 2 months ago secondary to loss of job and insurance coverage>> likely causing facial and arm numbness. Deferred ED recommendations by Dr.  Barnes and myself -Continue carvedilol 12.5 mg twice daily.restart  amlodipine at 10, lisinopril 40, ASA 81, Plavix 75  -Patient to check for SLN TG at his house and call us if he does not have any available for new prescription   3.  HLD: -LDL goal <70 -Last LDL was 44, ALT 18 on 12/2017   Current medicines are reviewed at length with the patient today.  The patient does not have concerns regarding medicines.  The following changes have been made: Increase carvedilol to 12.5 mg twice daily, add amlodipine 10, ASA 81, Plavix 75, lisinopril 40  Labs/ tests ordered today include: None  Orders Placed This Encounter  Procedures  . EKG 12-Lead    Disposition:   FU with myself or Dr. Radford Pax in 2 weeks  Signed, Kathyrn Drown, NP  04/13/2019 4:41 PM    [address], [place], [place]  [address] Phone: [phone]; Fax: [phone]

## 2019-04-13 ENCOUNTER — Ambulatory Visit (INDEPENDENT_AMBULATORY_CARE_PROVIDER_SITE_OTHER): Payer: 59 | Admitting: Cardiology

## 2019-04-13 ENCOUNTER — Encounter: Payer: Self-pay | Admitting: Cardiology

## 2019-04-13 VITALS — BP 182/88 | HR 86 | Ht 71.0 in | Wt 204.8 lb

## 2019-04-13 DIAGNOSIS — G4733 Obstructive sleep apnea (adult) (pediatric): Secondary | ICD-10-CM | POA: Diagnosis not present

## 2019-04-13 DIAGNOSIS — I169 Hypertensive crisis, unspecified: Secondary | ICD-10-CM | POA: Diagnosis not present

## 2019-04-13 DIAGNOSIS — I2510 Atherosclerotic heart disease of native coronary artery without angina pectoris: Secondary | ICD-10-CM | POA: Diagnosis not present

## 2019-04-13 DIAGNOSIS — E785 Hyperlipidemia, unspecified: Secondary | ICD-10-CM

## 2019-04-13 MED ORDER — CLOPIDOGREL BISULFATE 75 MG PO TABS: 75.0000 mg | ORAL_TABLET | Freq: Every day | ORAL | 3 refills | Status: DC

## 2019-04-13 MED ORDER — AMLODIPINE BESYLATE 10 MG PO TABS: 10.0000 mg | ORAL_TABLET | Freq: Every day | ORAL | 3 refills | Status: DC

## 2019-04-13 MED ORDER — CARVEDILOL 12.5 MG PO TABS: 12.5000 mg | ORAL_TABLET | Freq: Two times a day (BID) | ORAL | 3 refills | Status: DC

## 2019-04-13 NOTE — Patient Instructions (Addendum)
Medication Instructions:   Your physician has recommended you make the following change in your medication:   1) Increase your Carvedilol to 12.5MG , 1 tablet by mouth twice a day  *If you need a refill on your cardiac medications before your next appointment, please call your pharmacy*  Lab Work:  You will have labs drawn today: BMET  If you have labs (blood work) drawn today and your tests are completely normal, you will receive your results only by: Marland Kitchen MyChart Message (if you have MyChart) OR . A paper copy in the mail If you have any lab test that is abnormal or we need to change your treatment, we will call you to review the results.  Testing/Procedures:  None ordered today  Follow-Up:  On 05/03/19 at 11:40AM with Fransico Him, MD. Check your medications when you get home and see if you need refills. Call [phone] and ask for Raquel Sarna B, to let me know if I need to send them in.

## 2019-04-14 ENCOUNTER — Telehealth: Payer: Self-pay | Admitting: Cardiology

## 2019-04-14 NOTE — Telephone Encounter (Signed)
New Message  Pt c/o medication issue:  1. Name of Medication: amLODipine (10 mg), carvedilol (12.5 mg), clopidogrel (75 mg)   2. How are you currently taking this medication (dosage and times per day)? 12.5 mg - 2 in am and 2 in pm; 10 mg - 1 daily; 75 mg - 1 daily  3. Are you having a reaction (difficulty breathing--STAT)? N/A  4. What is your medication issue? Pt wants to know if that's all the medications he is supposed to be taking.

## 2019-04-14 NOTE — Telephone Encounter (Signed)
Patient calling to confirm what medications he should be taking. I reviewed his medication list with him and confirmed changes that were made at appointment on 11/12 with Kathyrn Drown, NP. Also advised patient that his medication list was on the last page of his AVS that he received yesterday. Patient verbalized understanding. He reports that this morning his BP was 153/84 and HR was 80.

## 2019-04-26 ENCOUNTER — Telehealth: Payer: Self-pay | Admitting: Cardiology

## 2019-04-26 NOTE — Telephone Encounter (Signed)
I called and left Thomas Dudley at [place] at [facility] a message concerning medications. Advised on message that patient is taking all medications as listed and that Carvedilol was increased at last office visit with Kathyrn Drown due to elevated blood pressure and being off blood pressure medications prior to office visit. Left office number in case Thomas Dudley needs to call back.

## 2019-04-26 NOTE — Telephone Encounter (Signed)
New message  Dr. Drema [place] nurse Sharee Pimple) from [place] is calling in to verify if patient is supposed to be taking coreg and metoprolol in addition to all other medicines. Please give Sharee Pimple a call back to confirm at [phone].

## 2019-05-02 ENCOUNTER — Encounter: Payer: Self-pay | Admitting: Neurology

## 2019-05-02 ENCOUNTER — Other Ambulatory Visit: Payer: Self-pay

## 2019-05-02 ENCOUNTER — Ambulatory Visit (INDEPENDENT_AMBULATORY_CARE_PROVIDER_SITE_OTHER): Payer: 59 | Admitting: Neurology

## 2019-05-02 VITALS — BP 156/83 | HR 70 | Temp 97.9°F | Ht 71.0 in | Wt 201.0 lb

## 2019-05-02 DIAGNOSIS — R200 Anesthesia of skin: Secondary | ICD-10-CM

## 2019-05-02 DIAGNOSIS — I6381 Other cerebral infarction due to occlusion or stenosis of small artery: Secondary | ICD-10-CM

## 2019-05-02 DIAGNOSIS — H53133 Sudden visual loss, bilateral: Secondary | ICD-10-CM

## 2019-05-02 MED ORDER — REPATHA SURECLICK 140 MG/ML ~~LOC~~ SOAJ: 140.0000 mg | SUBCUTANEOUS | 3 refills | Status: DC

## 2019-05-02 NOTE — Patient Instructions (Signed)
I had a long d/w patient about his recent TIA and lacunar stroke, risk for recurrent stroke/TIAs, personally independently reviewed imaging studies and stroke evaluation results and answered questions.Continue aspirin 81 mg daily and clopidogrel 75 mg daily given cardiac stents  for secondary stroke prevention and maintain strict control of hypertension with blood pressure goal below 130/90, diabetes with hemoglobin A1c goal below 6.5% and lipids with LDL cholesterol goal below 70 mg/dL. I also advised the patient to eat a healthy diet with plenty of whole grains, cereals, fruits and vegetables, exercise regularly and maintain ideal body weight .I have advised the patient to quit smoking completely and he seems agreeable and will try.  We will check MRI scan of the brain, MRA of the brain and neck and echocardiogram.  I recommend he start taking Repatha injection 140 mg/mL every 2 weeks as his LDL cholesterol is yet 141 mg percent on full dose Lipitor 80 mg daily.  Followup in the future with me in 2 months or call earlier if necessary.  Stroke Prevention Some medical conditions and behaviors are associated with a higher chance of having a stroke. You can help prevent a stroke by making nutrition, lifestyle, and other changes, including managing any medical conditions you may have. What nutrition changes can be made?   Eat healthy foods. You can do this by: ? Choosing foods high in fiber, such as fresh fruits and vegetables and whole grains. ? Eating at least 5 or more servings of fruits and vegetables a day. Try to fill half of your plate at each meal with fruits and vegetables. ? Choosing lean protein foods, such as lean cuts of meat, poultry without skin, fish, tofu, beans, and nuts. ? Eating low-fat dairy products. ? Avoiding foods that are high in salt (sodium). This can help lower blood pressure. ? Avoiding foods that have saturated fat, trans fat, and cholesterol. This can help prevent high  cholesterol. ? Avoiding processed and premade foods.  Follow your health care provider's specific guidelines for losing weight, controlling high blood pressure (hypertension), lowering high cholesterol, and managing diabetes. These may include: ? Reducing your daily calorie intake. ? Limiting your daily sodium intake to 1,500 milligrams (mg). ? Using only healthy fats for cooking, such as olive oil, canola oil, or sunflower oil. ? Counting your daily carbohydrate intake. What lifestyle changes can be made?  Maintain a healthy weight. Talk to your health care provider about your ideal weight.  Get at least 30 minutes of moderate physical activity at least 5 days a week. Moderate activity includes brisk walking, biking, and swimming.  Do not use any products that contain nicotine or tobacco, such as cigarettes and e-cigarettes. If you need help quitting, ask your health care provider. It may also be helpful to avoid exposure to secondhand smoke.  Limit alcohol intake to no more than 1 drink a day for nonpregnant women and 2 drinks a day for men. One drink equals 12 oz of beer, 5 oz of wine, or 1 oz of hard liquor.  Stop any illegal drug use.  Avoid taking birth control pills. Talk to your health care provider about the risks of taking birth control pills if: ? You are over 45 years old. ? You smoke. ? You get migraines. ? You have ever had a blood clot. What other changes can be made?  Manage your cholesterol levels. ? Eating a healthy diet is important for preventing high cholesterol. If cholesterol cannot be managed through diet  alone, you may also need to take medicines. ? Take any prescribed medicines to control your cholesterol as told by your health care provider.  Manage your diabetes. ? Eating a healthy diet and exercising regularly are important parts of managing your blood sugar. If your blood sugar cannot be managed through diet and exercise, you may need to take medicines.  ? Take any prescribed medicines to control your diabetes as told by your health care provider.  Control your hypertension. ? To reduce your risk of stroke, try to keep your blood pressure below 130/80. ? Eating a healthy diet and exercising regularly are an important part of controlling your blood pressure. If your blood pressure cannot be managed through diet and exercise, you may need to take medicines. ? Take any prescribed medicines to control hypertension as told by your health care provider. ? Ask your health care provider if you should monitor your blood pressure at home. ? Have your blood pressure checked every year, even if your blood pressure is normal. Blood pressure increases with age and some medical conditions.  Get evaluated for sleep disorders (sleep apnea). Talk to your health care provider about getting a sleep evaluation if you snore a lot or have excessive sleepiness.  Take over-the-counter and prescription medicines only as told by your health care provider. Aspirin or blood thinners (antiplatelets or anticoagulants) may be recommended to reduce your risk of forming blood clots that can lead to stroke.  Make sure that any other medical conditions you have, such as atrial fibrillation or atherosclerosis, are managed. What are the warning signs of a stroke? The warning signs of a stroke can be easily remembered as BEFAST.  B is for balance. Signs include: ? Dizziness. ? Loss of balance or coordination. ? Sudden trouble walking.  E is for eyes. Signs include: ? A sudden change in vision. ? Trouble seeing.  F is for face. Signs include: ? Sudden weakness or numbness of the face. ? The face or eyelid drooping to one side.  A is for arms. Signs include: ? Sudden weakness or numbness of the arm, usually on one side of the body.  S is for speech. Signs include: ? Trouble speaking (aphasia). ? Trouble understanding.  T is for time. ? These symptoms may represent a  serious problem that is an emergency. Do not wait to see if the symptoms will go away. Get medical help right away. Call your local emergency services (911 in the [place] Do not drive yourself to the hospital.  Other signs of stroke may include: ? A sudden, severe headache with no known cause. ? Nausea or vomiting. ? Seizure. Where to find more information For more information, visit:  American Stroke Association: www.strokeassociation.org  National Stroke Association: www.stroke.org Summary  You can prevent a stroke by eating healthy, exercising, not smoking, limiting alcohol intake, and managing any medical conditions you may have.  Do not use any products that contain nicotine or tobacco, such as cigarettes and e-cigarettes. If you need help quitting, ask your health care provider. It may also be helpful to avoid exposure to secondhand smoke.  Remember BEFAST for warning signs of stroke. Get help right away if you or a loved one has any of these signs. This information is not intended to replace advice given to you by your health care provider. Make sure you discuss any questions you have with your health care provider. Document Released: 06/25/2004 Document Revised: 04/30/2017 Document Reviewed: 06/23/2016 Elsevier Patient Education  2020 [employer]   

## 2019-05-02 NOTE — Progress Notes (Signed)
[facility] [address] [place]. [place] [id number] [phone]       OFFICE CONSULT NOTE  [name] Date of Birth:  03-Apr-1961 Medical Record Number:  [id number]   Referring MD: Thomas RainierElizabeth Dudley  Reason for Referral: TIA  HPI: [name] is a 58 year old Caucasian male seen today for initial office consultation visit for TIA.  History is obtained from the patient, review of referral notes and electronic medical records.  He has past medical history of hypertension, hyperlipidemia, smoking, myocardial infarction, sleep apnea who developed a transient episode of bilateral blurred vision while driving to [place] on the first week of November.  He managed to pull over onto a side road and just sat in his car for 15 to 20 minutes and his vision gradually improved back to normal and he was able to continue and drive.  He denied any accompanying symptoms in the form of headache, slurred speech, paresthesias or weakness at that time.  He did not seek medical advice.  5 days later he developed episode of sudden onset of right face and hand paresthesias which have persisted since then.  He denied any slurred speech weakness gait or balance problems.  He was seen by Dr. Concha NorwayMonths on 04/11/2019 who thought he may have slight slurred speech.  Patient was asked to continue aspirin and Plavix which she had been taking for his 8 cardiac stents and coronary artery disease.  Patient is also on Lipitor 80 mg daily.  He had lab work done that day which showed LDL cholesterol 141 mg percent despite being on Lipitor 80 mg daily and hemoglobin A1c was 6.2.  Patient states he has done well since then with paresthesias in the right face and hand have persisted.  He has had no recurrent vision loss episodes.  He continues to smoke 2 packs/day for several decades.  He has no prior history of strokes or TIAs.  ROS:   14 system review of systems is positive for numbness, tingling, blurred vision  and all other systems negative  PMH:  Past Medical History:  Diagnosis Date  . Allergic rhinitis   . Arthritis   . Back pain   . Coronary atherosclerosis of native coronary artery    post PCI of obtuse marginal in 2004 with no nonobstructive disease in other territories, cath 04/2008 PCI of OM2, PDA and distal RCA, s/p STEMI with PCI of right PL and [place] LV[place]  . Depression    takes Wellbutrin daily  . Diverticulitis   . Gastritis   . GERD (gastroesophageal reflux disease)    takes Protonix daily  . Hemorrhage of rectum and anus   . History of colon polyps   . HTN (hypertension)    takes Lisinopril and Metoprolol daily  . Hypercholesteremia    takes Lipitora daily  . Insomnia    takes Ambien nightly  . Insomnia   . Joint pain   . Myocardial infarction (HCC) 09/10/12  . OSA (obstructive sleep apnea)    sleep study done > 780yrs ago;uses CPAP  . Pneumonia    hx of ;about 5480yrs ago  . PVD (peripheral vascular disease) (HCC)   . Tobacco abuse     Social History:  Social History   Socioeconomic History  . Marital status: Married    Spouse name: Not on file  . Number of children: Not on file  . Years of education: Not on file  . Highest education level:  Not on file  Occupational History  . Not on file  Social Needs  . Financial resource strain: Not on file  . Food insecurity    Worry: Not on file    Inability: Not on file  . Transportation needs    Medical: Not on file    Non-medical: Not on file  Tobacco Use  . Smoking status: Current Every Day Smoker    Packs/day: 1.00    Years: 35.00    Pack years: 35.00  . Smokeless tobacco: Never Used  Substance and Sexual Activity  . Alcohol use: Yes    Alcohol/week: 0.0 standard drinks    Comment: 6pk every 6 months  . Drug use: No  . Sexual activity: Yes  Lifestyle  . Physical activity    Days per week: Not on file    Minutes per session: Not on file  . Stress: Not on file  Relationships   . Social Musician on phone: Not on file    Gets together: Not on file    Attends religious service: Not on file    Active member of club or organization: Not on file    Attends meetings of clubs or organizations: Not on file    Relationship status: Not on file  . Intimate partner violence    Fear of current or ex partner: Not on file    Emotionally abused: Not on file    Physically abused: Not on file    Forced sexual activity: Not on file  Other Topics Concern  . Not on file  Social History Narrative  . Not on file    Medications:   Current Outpatient Medications on File Prior to Visit  Medication Sig Dispense Refill  . amitriptyline (ELAVIL) 25 MG tablet Take 75 mg by mouth at bedtime.    Marland Kitchen amLODipine (NORVASC) 10 MG tablet Take 1 tablet (10 mg total) by mouth daily. 90 tablet 3  . aspirin 81 MG tablet Take 1 tablet (81 mg total) by mouth daily.    Marland Kitchen atorvastatin (LIPITOR) 80 MG tablet Take 1 tablet (80 mg total) by mouth daily at 6 PM. 90 tablet 3  . carvedilol (COREG) 12.5 MG tablet Take 1 tablet (12.5 mg total) by mouth 2 (two) times daily with a meal. 180 tablet 3  . clopidogrel (PLAVIX) 75 MG tablet Take 1 tablet (75 mg total) by mouth daily. 90 tablet 3  . lisinopril (PRINIVIL,ZESTRIL) 40 MG tablet TAKE 1 TABLET DAILY (PLEASE KEEP UPCOMING APPOINTMENT FOR FURTHER REFILLS) 90 tablet 3  . metoprolol succinate (TOPROL-XL) 100 MG 24 hr tablet TAKE 1 TABLET DAILY (PLEASE KEEP UPCOMING APPOINTMENT FOR FURTHER REFILLS) 90 tablet 3  . nitroGLYCERIN (NITROSTAT) 0.4 MG SL tablet Place 1 tablet (0.4 mg total) under the tongue every 5 (five) minutes as needed for chest pain (MAX 3 TABLETS). 75 tablet 2  . pantoprazole (PROTONIX) 40 MG tablet TAKE 1 TABLET DAILY (PLEASE KEEP UPCOMING APPOINTMENT FOR FURTHER REFILLS) 90 tablet 3   No current facility-administered medications on file prior to visit.     Allergies:   Allergies  Allergen Reactions  . Neomycin-Bacitracin  Zn-Polymyx Other (See Comments)    Blisters  Blisters     Physical Exam General: well developed, well nourished middle-aged Caucasian male, seated, in no evident distress Head: head normocephalic and atraumatic.   Neck: supple with no carotid or supraclavicular bruits Cardiovascular: regular rate and rhythm, no murmurs Musculoskeletal: no deformity Skin:  no rash/petichiae  Vascular:  Normal pulses all extremities  Neurologic Exam Mental Status: Awake and fully alert. Oriented to place and time. Recent and remote memory intact. Attention span, concentration and fund of knowledge appropriate. Mood and affect appropriate.  Cranial Nerves: Fundoscopic exam reveals sharp disc margins. Pupils equal, briskly reactive to light. Extraocular movements full without nystagmus. Visual fields full to confrontation. Hearing intact. Facial sensation intact. Face, tongue, palate moves normally and symmetrically.  Motor: Normal bulk and tone. Normal strength in all tested extremity muscles. Sensory.:  Diminished touch pinprick sensation in the right face and upper extremity.  Normal position vibration sensation. Coordination: Rapid alternating movements normal in all extremities. Finger-to-nose and heel-to-shin performed accurately bilaterally. Gait and Station: Arises from chair without difficulty. Stance is normal. Gait demonstrates normal stride length and balance . Able to heel, toe and tandem walk without difficulty.  Reflexes: 1+ and symmetric. Toes downgoing.   NIHSS  1 Modified Rankin  2   ASSESSMENT: 58 year old Caucasian male with episode of transient bilateral vision loss likely posterior circulation TIA in November 2020 followed 5 days later by left brain subcortical lacunar infarct with no residual right face and hand paresthesias.  Vascular risk factors of smoking, hypertension hyperlipidemia.     PLAN: I had a long d/w patient about his recent TIA and lacunar stroke, risk for recurrent  stroke/TIAs, personally independently reviewed imaging studies and stroke evaluation results and answered questions.Continue aspirin 81 mg daily and clopidogrel 75 mg daily given cardiac stents  for secondary stroke prevention and maintain strict control of hypertension with blood pressure goal below 130/90, diabetes with hemoglobin A1c goal below 6.5% and lipids with LDL cholesterol goal below 70 mg/dL. I also advised the patient to eat a healthy diet with plenty of whole grains, cereals, fruits and vegetables, exercise regularly and maintain ideal body weight .I have advised the patient to quit smoking completely and he seems agreeable and will try.  We will check MRI scan of the brain, MRA of the brain and neck and echocardiogram.  I recommend he start taking Repatha injection 140 mg/mL every 2 weeks as his LDL cholesterol is yet 141 mg percent on full dose Lipitor 80 mg daily.  Greater than 50% time during this 45-minute consultation visit was spent in counseling and coordination of care about his TIA and suspected lacunar stroke and answering questions.  Followup in the future with me in 2 months or call earlier if necessary. Antony Contras, MD  [facility] [address] [place], [place] [address]  Phone [phone] Fax [phone] Note: This document was prepared with digital dictation and possible smart phrase technology. Any transcriptional errors that result from this process are unintentional.

## 2019-05-03 ENCOUNTER — Other Ambulatory Visit: Payer: Self-pay

## 2019-05-03 ENCOUNTER — Ambulatory Visit (INDEPENDENT_AMBULATORY_CARE_PROVIDER_SITE_OTHER): Payer: 59 | Admitting: Cardiology

## 2019-05-03 ENCOUNTER — Encounter: Payer: Self-pay | Admitting: Cardiology

## 2019-05-03 VITALS — BP 166/92 | HR 89 | Ht 71.0 in | Wt 201.8 lb

## 2019-05-03 DIAGNOSIS — G4733 Obstructive sleep apnea (adult) (pediatric): Secondary | ICD-10-CM | POA: Diagnosis not present

## 2019-05-03 DIAGNOSIS — I10 Essential (primary) hypertension: Secondary | ICD-10-CM | POA: Diagnosis not present

## 2019-05-03 DIAGNOSIS — R072 Precordial pain: Secondary | ICD-10-CM

## 2019-05-03 DIAGNOSIS — E785 Hyperlipidemia, unspecified: Secondary | ICD-10-CM

## 2019-05-03 DIAGNOSIS — I2510 Atherosclerotic heart disease of native coronary artery without angina pectoris: Secondary | ICD-10-CM

## 2019-05-03 DIAGNOSIS — I639 Cerebral infarction, unspecified: Secondary | ICD-10-CM

## 2019-05-03 MED ORDER — CARVEDILOL 12.5 MG PO TABS: 12.5000 mg | ORAL_TABLET | Freq: Two times a day (BID) | ORAL | 3 refills | Status: DC

## 2019-05-03 MED ORDER — LISINOPRIL 40 MG PO TABS: ORAL_TABLET | ORAL | 3 refills | Status: DC

## 2019-05-03 MED ORDER — AMLODIPINE BESYLATE 10 MG PO TABS: 10.0000 mg | ORAL_TABLET | Freq: Every day | ORAL | 3 refills | Status: DC

## 2019-05-03 MED ORDER — METOPROLOL SUCCINATE ER 100 MG PO TB24: ORAL_TABLET | ORAL | 3 refills | Status: DC

## 2019-05-03 MED ORDER — PANTOPRAZOLE SODIUM 40 MG PO TBEC: DELAYED_RELEASE_TABLET | ORAL | 3 refills | Status: DC

## 2019-05-03 NOTE — Progress Notes (Signed)
Cardiology Office Note:    Date:  05/03/2019   ID:  Thomas Breathonald R Oehler, DOB 04/10/1961, MRN [id number]  PCP:  Juluis RainierBarnes, Elizabeth, MD  Cardiologist:  Armanda Magicraci Turner, MD    Referring MD: Juluis RainierBarnes, Elizabeth, MD   Chief Complaint  Patient presents with  . Coronary Artery Disease  . Hypertension  . Sleep Apnea  . Hyperlipidemia    History of Present Illness:    Thomas Dudley is a 58 y.o. male with a hx  of HTN, OSA, dyslipidemia and CAD in the setting of STEMI with PCI ofobtuse marginal in 2004 with no nonobstructive disease in other territories. Repeatcath 04/2008 with PCI of OM2, PDA and distal RCA. S/pSTEMI with PCI of right PL and normal LVF [place]. He also has a history of OSA, HTN, PVD, tobacco abuse and dyslipidemia.   He is here today for followup.  Unfortunately he has had some neurologic problems since I saw him last.  He developed numbness on the right side of his face and right arm and lost his vision for 20 minutes.  He was seen in our clinic initially and felt to be secondary to hypertensive crisis.  They seen by Dr. Pearlean BrownieSethi with Neuro and thought he may have had a lacuna CVA.  Head CT was negative for acute disease.  2D echo was ordered and pending.  He also has a head/neck MRI pending.  He also had had some problems with intermittent exertional chest tightness that was initially felt to be related to his hypertensive crisis and have improve with better BP control but still occasionally has tightness.   He denies any  SOB, DOE, PND, orthopnea, LE edema, dizziness, palpitations or syncope. He is compliant with his meds and is tolerating meds with no SE.    Past Medical History:  Diagnosis Date  . Allergic rhinitis   . Arthritis   . Back pain   . Coronary atherosclerosis of native coronary artery    post PCI of obtuse marginal in 2004 with no nonobstructive disease in other territories, cath 04/2008 PCI of OM2, PDA and distal RCA, s/p STEMI with PCI of right  PL and normal LVF 08[place]2012 [place]  . Depression    takes Wellbutrin daily  . Diverticulitis   . Gastritis   . GERD (gastroesophageal reflux disease)    takes Protonix daily  . Hemorrhage of rectum and anus   . History of colon polyps   . HTN (hypertension)    takes Lisinopril and Metoprolol daily  . Hypercholesteremia    takes Lipitora daily  . Insomnia    takes Ambien nightly  . Insomnia   . Joint pain   . Myocardial infarction (HCC) 09/10/12  . OSA (obstructive sleep apnea)    sleep study done > 6436yrs ago;uses CPAP  . Pneumonia    hx of ;about 7136yrs ago  . PVD (peripheral vascular disease) (HCC)   . Tobacco abuse     Past Surgical History:  Procedure Laterality Date  . ADENOIDECTOMY    . CARDIAC CATHETERIZATION     multiple  . CARPAL TUNNEL RELEASE Right   . COLONOSCOPY    . CORONARY ANGIOPLASTY     total of 6 stents   . KNEE ARTHROSCOPY Left 09/22/2013   Procedure: LEFT KNEE ARTHROSCOPY ;  Surgeon: Verlee RossettiSteven R Norris, MD;  [facility];  Service: Orthopedics;  Laterality: Left;  . NASAL SEPTOPLASTY W/ TURBINOPLASTY    . TONSILLECTOMY  Current Medications: Current Meds  Medication Sig  . amitriptyline (ELAVIL) 25 MG tablet Take 75 mg by mouth at bedtime.  Marland Kitchen amLODipine (NORVASC) 10 MG tablet Take 1 tablet (10 mg total) by mouth daily.  Marland Kitchen aspirin 81 MG tablet Take 1 tablet (81 mg total) by mouth daily.  Marland Kitchen atorvastatin (LIPITOR) 80 MG tablet Take 1 tablet (80 mg total) by mouth daily at 6 PM.  . carvedilol (COREG) 12.5 MG tablet Take 1 tablet (12.5 mg total) by mouth 2 (two) times daily with a meal.  . clopidogrel (PLAVIX) 75 MG tablet Take 1 tablet (75 mg total) by mouth daily.  Marland Kitchen lisinopril (ZESTRIL) 40 MG tablet TAKE 1 TABLET DAILY  . metoprolol succinate (TOPROL-XL) 100 MG 24 hr tablet TAKE 1 TABLET DAILY  . nitroGLYCERIN (NITROSTAT) 0.4 MG SL tablet Place 1 tablet (0.4 mg total) under the tongue every 5 (five) minutes as needed for chest pain (MAX 3  TABLETS).  . pantoprazole (PROTONIX) 40 MG tablet Take one tablet daily  . [DISCONTINUED] amLODipine (NORVASC) 10 MG tablet Take 1 tablet (10 mg total) by mouth daily.  . [DISCONTINUED] carvedilol (COREG) 12.5 MG tablet Take 1 tablet (12.5 mg total) by mouth 2 (two) times daily with a meal.  . [DISCONTINUED] lisinopril (PRINIVIL,ZESTRIL) 40 MG tablet TAKE 1 TABLET DAILY (PLEASE KEEP UPCOMING APPOINTMENT FOR FURTHER REFILLS)  . [DISCONTINUED] metoprolol succinate (TOPROL-XL) 100 MG 24 hr tablet TAKE 1 TABLET DAILY (PLEASE KEEP UPCOMING APPOINTMENT FOR FURTHER REFILLS)  . [DISCONTINUED] pantoprazole (PROTONIX) 40 MG tablet TAKE 1 TABLET DAILY (PLEASE KEEP UPCOMING APPOINTMENT FOR FURTHER REFILLS)     Allergies:   Neomycin-bacitracin zn-polymyx   Social History   Socioeconomic History  . Marital status: Married    Spouse name: Not on file  . Number of children: Not on file  . Years of education: Not on file  . Highest education level: Not on file  Occupational History  . Not on file  Social Needs  . Financial resource strain: Not on file  . Food insecurity    Worry: Not on file    Inability: Not on file  . Transportation needs    Medical: Not on file    Non-medical: Not on file  Tobacco Use  . Smoking status: Current Every Day Smoker    Packs/day: 1.00    Years: 35.00    Pack years: 35.00  . Smokeless tobacco: Never Used  Substance and Sexual Activity  . Alcohol use: Yes    Alcohol/week: 0.0 standard drinks    Comment: 6pk every 6 months  . Drug use: No  . Sexual activity: Yes  Lifestyle  . Physical activity    Days per week: Not on file    Minutes per session: Not on file  . Stress: Not on file  Relationships  . Social Musician on phone: Not on file    Gets together: Not on file    Attends religious service: Not on file    Active member of club or organization: Not on file    Attends meetings of clubs or organizations: Not on file    Relationship status:  Not on file  Other Topics Concern  . Not on file  Social History Narrative  . Not on file     Family History: The patient's family history includes Breast cancer in his mother; Heart disease in his mother; Lung cancer in his father.  ROS:   Please see the history of  present illness.    ROS  All other systems reviewed and negative.   EKGs/Labs/Other Studies Reviewed:    The following studies were reviewed today: none  EKG:  EKG is not ordered today.    Recent Labs: No results found for requested labs within last 8760 hours.   Recent Lipid Panel    Component Value Date/Time   CHOL 120 (L) 06/12/2015 0821   TRIG 116 06/12/2015 0821   HDL 39 (L) 06/12/2015 0821   CHOLHDL 3.1 06/12/2015 0821   VLDL 23 06/12/2015 0821   LDLCALC 58 06/12/2015 0821    Physical Exam:    VS:  BP (!) 166/92   Pulse 89   Ht 5\' 11"  (1.803 m)   Wt 201 lb 12.8 oz (91.5 kg)   SpO2 90%   BMI 28.15 kg/m     Wt Readings from Last 3 Encounters:  05/03/19 201 lb 12.8 oz (91.5 kg)  05/02/19 201 lb (91.2 kg)  04/13/19 204 lb 12.8 oz (92.9 kg)     GEN:  Well nourished, well developed in no acute distress HEENT: Normal NECK: No JVD; No carotid bruits LYMPHATICS: No lymphadenopathy CARDIAC: RRR, no murmurs, rubs, gallops RESPIRATORY:  Clear to auscultation without rales, wheezing or rhonchi  ABDOMEN: Soft, non-tender, non-distended MUSCULOSKELETAL:  No edema; No deformity  SKIN: Warm and dry NEUROLOGIC:  Alert and oriented x 3 PSYCHIATRIC:  Normal affect   ASSESSMENT:    1. Coronary artery disease involving native coronary artery of native heart without angina pectoris   2. Essential hypertension   3. OSA (obstructive sleep apnea)   4. Dyslipidemia, goal LDL below 70   5. Cerebrovascular accident (CVA), unspecified mechanism ([place])   6. Precordial pain    PLAN:    In order of problems listed above:  1.  ASCAD -s/p remote STEMI with PCI ofobtuse marginal in 2004 with nonobstructive  disease in other territories. Repeatcath 04/2008 with PCI of OM2, PDA and distal RCA. S/pSTEMI with PCI of right PL and normal LVF 08/2012 in [facility] [place] -he has had ongoing intermittent exertional CP for several months -initial felt to be related to poorly controlled BP and has improved some with better Bp control -continue ASA, Plavix 75mg  daily, BB and statin -check Lexiscan myoview to rule out ischemia  2.  HTN -BP borderline controlled on exam but at home has been in the 130'70's.  -continue amlodipine 10mg  daily, Carvedilol 12.5mg  BID, Lisnipril 40mg  daily and Toprol XL 100mg  daily  3.  OSA - The patient is tolerating PAP therapy well without any problems. The PAP download was reviewed today and showed an AHI of 2/hr on 17 cm H2O with 86% compliance in using more than 4 hours nightly.  The patient has been using and benefiting from PAP use and will continue to benefit from therapy.   4.  HLD -LDL goal < 70 -LDL was 141 last month -continue Atorvastatin 80mg  daily -Dr. Leonie Man recommended that he start on Praluent -I will refer him to lipid clinic to get this going and then repeat lipids 2 months later  5.  Possible recent lacunar infarct/TIA -followed by Dr. Leonie Man with Neuro -initially thought to be due to hypertensive crisis after stopping meds -Head MRI pending -needs aggressive treatment of lipids -refer to lipid clinic for Praluent -continue ASA 81mg  daily and Plavix 75mg  daily -continue atorvastatin -2d echo pending -check carotid dopplers to rule out carotid stenosis    Medication Adjustments/Labs and Tests Ordered: Current medicines  are reviewed at length with the patient today.  Concerns regarding medicines are outlined above.  Orders Placed This Encounter  Procedures  . AMB Referral to [facility]  . MYOCARDIAL PERFUSION IMAGING - Lexiscan  . VAS US CAROTID   Meds ordered this encounter  Medications  . amLODipine (NORVASC) 10 MG  tablet    Sig: Take 1 tablet (10 mg total) by mouth daily.    Dispense:  90 tablet    Refill:  3  . carvedilol (COREG) 12.5 MG tablet    Sig: Take 1 tablet (12.5 mg total) by mouth 2 (two) times daily with a meal.    Dispense:  180 tablet    Refill:  3  . lisinopril (ZESTRIL) 40 MG tablet    Sig: TAKE 1 TABLET DAILY    Dispense:  90 tablet    Refill:  3  . metoprolol succinate (TOPROL-XL) 100 MG 24 hr tablet    Sig: TAKE 1 TABLET DAILY    Dispense:  90 tablet    Refill:  3  . pantoprazole (PROTONIX) 40 MG tablet    Sig: Take one tablet daily    Dispense:  90 tablet    Refill:  3    Signed, Armanda Magic, MD  05/03/2019 8:26 PM    [facility]

## 2019-05-03 NOTE — Patient Instructions (Addendum)
Medication Instructions:  Your physician recommends that you continue on your current medications as directed. Please refer to the Current Medication list given to you today.  *If you need a refill on your cardiac medications before your next appointment, please call your pharmacy*  Lab Work: None ordered.   Testing/Procedures: Your physician has requested that you have a lexiscan myoview. For further information please visit [email or web address]. Please follow instruction sheet, as given.  Your physician has requested that you have a carotid duplex. This test is an ultrasound of the carotid arteries in your neck. It looks at blood flow through these arteries that supply the brain with blood. Allow one hour for this exam. There are no restrictions or special instructions.   Follow-Up: At [facility], you and your health needs are our priority.  As part of our continuing mission to provide you with exceptional heart care, we have created designated Provider Care Teams.  These Care Teams include your primary Cardiologist (physician) and Advanced Practice Providers (APPs -  Physician Assistants and Nurse Practitioners) who all work together to provide you with the care you need, when you need it.  Your next appointment:   1 year(s)  The format for your next appointment:   In Person  Provider:    Fransico Him, MD   Other Instructions  Please keep upcoming appointment for echocardiogram on December 10th, 2020 at 3:45PM.  You have been referred to the [facility].

## 2019-05-04 ENCOUNTER — Other Ambulatory Visit: Payer: Self-pay | Admitting: Cardiology

## 2019-05-04 ENCOUNTER — Telehealth: Payer: Self-pay

## 2019-05-04 MED ORDER — CLOPIDOGREL BISULFATE 75 MG PO TABS: 75.0000 mg | ORAL_TABLET | Freq: Every day | ORAL | 3 refills | Status: DC

## 2019-05-04 MED ORDER — AMLODIPINE BESYLATE 10 MG PO TABS: 10.0000 mg | ORAL_TABLET | Freq: Every day | ORAL | 3 refills | Status: DC

## 2019-05-04 MED ORDER — ATORVASTATIN CALCIUM 80 MG PO TABS: 80.0000 mg | ORAL_TABLET | Freq: Every day | ORAL | 3 refills | Status: DC

## 2019-05-04 MED ORDER — CARVEDILOL 12.5 MG PO TABS: 12.5000 mg | ORAL_TABLET | Freq: Two times a day (BID) | ORAL | 3 refills | Status: DC

## 2019-05-04 MED ORDER — PANTOPRAZOLE SODIUM 40 MG PO TBEC: DELAYED_RELEASE_TABLET | ORAL | 3 refills | Status: DC

## 2019-05-04 NOTE — Telephone Encounter (Signed)
I tried to do PA on cover my meds. They stated to call [employer] at  [phone]. They stated PA form will be sent today within 4 hours.

## 2019-05-04 NOTE — Telephone Encounter (Signed)
Pt's medications were resent to pt's pharmacy as requested. Confirmation received.  °

## 2019-05-04 NOTE — Telephone Encounter (Signed)
PA form for repatha done via paper. Fax lipid panel results, office notes, med list to [phone]. Everything was fax twice and confirmed via fax. Waiting for decision.

## 2019-05-04 NOTE — Telephone Encounter (Signed)
I received a PA request from [facility] for the pts Pantoprazole. This medication has been being filled under Dr Radford Pax but it is unclear what the pt is taking it for.  I did call the pt but got no answer and no way to leave a VM because it is full.  Will forward to Dr Radford Pax for advisement.

## 2019-05-08 NOTE — Telephone Encounter (Signed)
Per Dr Landis Gandy OV notes from 05/18/2014 trhe pt takes Pantoprazole for GERD.  I Attempted to do this Pantoprazole PA through covermymeds but could not as I received a message stating I will need to call [employer] at [phone] to do this PA.  I called [employer] and s/w Danila who advised me that all Proton pump inhibitors are excluded from the pts ins plan with them.  I attempted to notify the pt but got no answer and his Voice mail box is full.

## 2019-05-08 NOTE — Telephone Encounter (Addendum)
Repatha sure injection 140mg /ml approve from 05/05/2019 to 05/03/2020. Any questions call [phone]. Approve via [employer].

## 2019-05-09 ENCOUNTER — Telehealth (HOSPITAL_COMMUNITY): Payer: Self-pay | Admitting: *Deleted

## 2019-05-09 NOTE — Telephone Encounter (Signed)
Patient given detailed instructions per Myocardial Perfusion Study Information Sheet for the test on 05/11/19 at 7:15. Patient notified to arrive 15 minutes early and that it is imperative to arrive on time for appointment to keep from having the test rescheduled.  If you need to cancel or reschedule your appointment, please call the office within 24 hours of your appointment. . Patient verbalized understanding.Thomas Dudley

## 2019-05-10 NOTE — Progress Notes (Signed)
Patient ID: Thomas Dudley                 DOB: July 15, 1960                    MRN: [id number]     HPI: Thomas Dudley is a 58 y.o. male patient referred to lipid clinic by Dr Radford Pax. PMH is significant for HTN, HLD, OSA, tobacco abuse, PVD, CAD s/p STEMI with PCI of obtuse marginal in 2004. Repeat cath 04/2008 with PCI of OM2, PDA and distal RCA. STEMI with PCI of right PL in 2014 in [place]. Most recently, pt had a lacunar stroke in November 2020 while in [place] and was seen by neurology on 05/02/19. He was referred to lipid clinic to discuss addition of PCSK9i therapy as LDL remains above goal on high intensity statin therapy.  Pt presents today in good spirits. Reports tolerating atorvastatin well. He has been using nicorette lozenges and has completely cut out tobacco use. Chews on toothpicks as well to help. He has also cut back on soda - he used to drink 10-12 mountain dews each day, now is drinking 2 each day.  Current Medications: atorvastatin '80mg'$  daily Risk Factors: recurrent ASCVD with MI and stenting (8 stents total), PVD, tobacco abuse, CVA LDL goal: '55mg'$ /dL  Diet: Avoiding vending machine food for the past few weeks. Eating more fruits. Does like whole milk and doesn't want to change- discussed watching portion sizes. Drinking less soda.  Exercise: Started walking 2 miles per day, starting a weight program as well - states MD wants him to weigh closer to 280 lbs  Family History: The patient's family history includes Breast cancer in his mother; Heart disease in his mother; Lung cancer in his father.  Social History: Smokes 1 PPD for 35 years. Occasional alcohol use, denies illicit drug use.  Labs: 04/11/19: TC 222, TG 138, HDL 53, LDL 141 (atorvastatin '80mg'$  daily)  Past Medical History:  Diagnosis Date  . Allergic rhinitis   . Arthritis   . Back pain   . Coronary atherosclerosis of native coronary artery    post PCI of obtuse marginal in 2004 with no  nonobstructive disease in other territories, cath 04/2008 PCI of OM2, PDA and distal RCA, s/p STEMI with PCI of right PL and normal LVF 08/2012 in [place]  . Depression    takes Wellbutrin daily  . Diverticulitis   . Gastritis   . GERD (gastroesophageal reflux disease)    takes Protonix daily  . Hemorrhage of rectum and anus   . History of colon polyps   . HTN (hypertension)    takes Lisinopril and Metoprolol daily  . Hypercholesteremia    takes Lipitora daily  . Insomnia    takes Ambien nightly  . Insomnia   . Joint pain   . Myocardial infarction ([place]) 09/10/12  . OSA (obstructive sleep apnea)    sleep study done > 58yr ago;uses CPAP  . Pneumonia    hx of ;about 89yrago  . PVD (peripheral vascular disease) ([facility]  . Tobacco abuse     Current Outpatient Medications on File Prior to Visit  Medication Sig Dispense Refill  . amitriptyline (ELAVIL) 25 MG tablet Take 75 mg by mouth at bedtime.    . Marland KitchenmLODipine (NORVASC) 10 MG tablet Take 1 tablet (10 mg total) by mouth daily. 90 tablet 3  . aspirin 81 MG tablet Take 1 tablet (81 mg total) by mouth  daily.    . atorvastatin (LIPITOR) 80 MG tablet Take 1 tablet (80 mg total) by mouth daily at 6 PM. 90 tablet 3  . carvedilol (COREG) 12.5 MG tablet Take 1 tablet (12.5 mg total) by mouth 2 (two) times daily with a meal. 180 tablet 3  . clopidogrel (PLAVIX) 75 MG tablet Take 1 tablet (75 mg total) by mouth daily. 90 tablet 3  . Evolocumab (REPATHA SURECLICK) 563 MG/ML SOAJ Inject into the skin every 14 (fourteen) days.    Marland Kitchen lisinopril (ZESTRIL) 40 MG tablet TAKE 1 TABLET DAILY 90 tablet 3  . metoprolol succinate (TOPROL-XL) 100 MG 24 hr tablet TAKE 1 TABLET DAILY 90 tablet 3  . nitroGLYCERIN (NITROSTAT) 0.4 MG SL tablet Place 1 tablet (0.4 mg total) under the tongue every 5 (five) minutes as needed for chest pain (MAX 3 TABLETS). 75 tablet 2  . pantoprazole (PROTONIX) 40 MG tablet Take one tablet daily 90 tablet 3   No current  facility-administered medications on file prior to visit.     Allergies  Allergen Reactions  . Neomycin-Bacitracin Zn-Polymyx Other (See Comments)    Blisters  Blisters     Assessment/Plan:  1. Hyperlipidemia - LDL 141 on atorvastatin '80mg'$  daily, above goal < 55 due to extensive ASCVD. Discussed PCSK9i therapy today including expected benefits, side effects, and injection techniques. Prior authorization for Repatha has been submitted and approved. Pt qualifies for $5 copay card which has been activated and sent to pharmacy. Recheck fasting lipids in 2 months to assess efficacy.  Megan E. Supple, PharmD, BCACP, [facility] [address], [place], [place] [address] Phone: [phone]; Fax: [phone] 05/11/2019 10:25 AM   [employer] Evolocumab [id number] Site No: [id number] Subject ID No: [id number]     Did subject meet all eligibility criteria?  Code  No Yes  66  Adults age ? 40 years '[]'$  '[x]'$   One or both of the following:   102 A  Hospitalization for a clinical ASCVD event: acute (MI), UA, IS, or CLI within 18 months of enrollment        Note: Subjects must have been admitted to the hospital. Those       who are admitted and discharged in less than 24 hours are      Eligible for the study.Subjects who have been admitted to     The ER for a clinical ASCVD. '[]'$  '[x]'$   102B Coronary or peripheral revascularization including percutaneous or surgical revascularization in the past 18 months '[x]'$  '[]'$   One of the following:  103 A  LDL ? 70 mg/dL (1.81 mmol/L) with no plans for immediate initiation or titration of statin therapy '[]'$  '[x]'$   103 B Newly started on PCSK9i after the index hospitalization/procedure and prior to enrollment (but no more than 6 months prior to enrollment) with pre-PCSK9i treatment LDL-C value available and measured within 6 months of starting PCSK9i and known background LLT any time prior to PCSK9i initiation. '[x]'$  '[]'$   105 Planned follow-up within the  health system '[]'$  '[x]'$   Subject does not meet any of the following exclusion criteria   201 Unable or unwilling to provide informed consent, including but not limited to cognitive or language barriers (reading or comprehension) '[x]'$  '[]'$   202 Lack of phone or email for contact '[x]'$  '[]'$   203 Evidence of end stage renal disease (ESRD) or stage 5 CKD '[x]'$  '[]'$   204 Anticipated life expectancy less than 6 month  '[x]'$  '[]'$   206  On a PCSK9i prior to their qualifying event _0  _1      Subject Name: IRBY FAILS  Subject met inclusion and exclusion criteria.  The informed consent form, study requirements and expectations were reviewed with the subject and questions and concerns were addressed prior to the signing of the consent form.  The subject verbalized understanding of the trial requirements.  The subject agreed to participate in the Train[facility] trial and signed the informed consent at 9:30am on12/10/20.  The informed consent was obtained prior to performance of any protocol-specific procedures for the subject.  A copy of the signed informed consent was given to the subject and a copy was placed in the subject's medical record.   Megan Supple   Socioeconomic Status - Baseline  Education Some High School or Less High School graduate Some College/University Graduated College/University or above _2  _3  _4  _5    Marital Status Married Single Divorced Separated _6  _7  _8  _9    Income <$15,000 $15,001 - $34,999 $35,000 - $74,999 $75,000 - $99,999 >$100,000 _10  _11  _12  _13  _14

## 2019-05-11 ENCOUNTER — Ambulatory Visit (HOSPITAL_COMMUNITY)
Admission: RE | Admit: 2019-05-11 | Discharge: 2019-05-11 | Disposition: A | Discharge status: Home / Self Care | Payer: 59 | Source: Ambulatory Visit | Attending: Internal Medicine | Admitting: Internal Medicine

## 2019-05-11 ENCOUNTER — Telehealth: Payer: Self-pay | Admitting: *Deleted

## 2019-05-11 ENCOUNTER — Other Ambulatory Visit (HOSPITAL_COMMUNITY): Payer: 59

## 2019-05-11 ENCOUNTER — Ambulatory Visit (INDEPENDENT_AMBULATORY_CARE_PROVIDER_SITE_OTHER): Payer: 59 | Admitting: Pharmacist

## 2019-05-11 ENCOUNTER — Telehealth: Payer: Self-pay | Admitting: Pharmacist

## 2019-05-11 ENCOUNTER — Ambulatory Visit (HOSPITAL_BASED_OUTPATIENT_CLINIC_OR_DEPARTMENT_OTHER): Payer: 59

## 2019-05-11 ENCOUNTER — Other Ambulatory Visit: Payer: Self-pay

## 2019-05-11 VITALS — BP 118/64 | HR 66 | Ht 71.0 in | Wt 195.8 lb

## 2019-05-11 DIAGNOSIS — I639 Cerebral infarction, unspecified: Secondary | ICD-10-CM | POA: Diagnosis present

## 2019-05-11 DIAGNOSIS — R072 Precordial pain: Secondary | ICD-10-CM | POA: Diagnosis present

## 2019-05-11 DIAGNOSIS — E785 Hyperlipidemia, unspecified: Secondary | ICD-10-CM | POA: Diagnosis not present

## 2019-05-11 DIAGNOSIS — I6381 Other cerebral infarction due to occlusion or stenosis of small artery: Secondary | ICD-10-CM | POA: Diagnosis present

## 2019-05-11 DIAGNOSIS — I6523 Occlusion and stenosis of bilateral carotid arteries: Secondary | ICD-10-CM

## 2019-05-11 LAB — ECHOCARDIOGRAM COMPLETE
Height: 71 in
Weight: 3216 oz

## 2019-05-11 LAB — MYOCARDIAL PERFUSION IMAGING
LV dias vol: 110 mL (ref 62–150)
LV sys vol: 56 mL
Peak HR: 71 {beats}/min
Rest HR: 58 {beats}/min
SDS: 0
SRS: 0
SSS: 0
TID: 1.18

## 2019-05-11 MED ORDER — TECHNETIUM TC 99M TETROFOSMIN IV KIT
31.3000 | PACK | Freq: Once | INTRAVENOUS | Status: AC | PRN
  Administered 2019-05-11: 31.3 via INTRAVENOUS
  Filled 2019-05-11: qty 32

## 2019-05-11 MED ORDER — REGADENOSON 0.4 MG/5ML IV SOLN
0.4000 mg | Freq: Once | INTRAVENOUS | Status: AC
  Administered 2019-05-11: 0.4 mg via INTRAVENOUS

## 2019-05-11 MED ORDER — REPATHA SURECLICK 140 MG/ML ~~LOC~~ SOAJ: 1.0000 "pen " | SUBCUTANEOUS | 11 refills | Status: DC

## 2019-05-11 MED ORDER — PANTOPRAZOLE SODIUM 40 MG PO TBEC: DELAYED_RELEASE_TABLET | ORAL | 3 refills | Status: AC

## 2019-05-11 MED ORDER — TECHNETIUM TC 99M TETROFOSMIN IV KIT
10.3000 | PACK | Freq: Once | INTRAVENOUS | Status: AC | PRN
  Administered 2019-05-11: 10.3 via INTRAVENOUS
  Filled 2019-05-11: qty 11

## 2019-05-11 NOTE — Patient Instructions (Addendum)
It was nice to meet you today  Your LDL cholesterol is 141. We would like it < 70 and ideally closer to 55  Continue taking atorvastatin 80mg  daily  I will submit paperwork for Repatha injections that you would take every 14 days. They will lower your LDL cholesterol an extra 60%  We will recheck your cholesterol in 2 months to see how your numbers look. Come in on Wednesday, February 10th for fasting lab work any time after 7:30am

## 2019-05-11 NOTE — Telephone Encounter (Signed)
Thomas Margarita, MD  05/11/2019 12:49 PM EST    1-39% bilateral carotid stenosis. 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  Please get a copy lipids from PCP. Please find out if patient has had any dizziness, vertigo.  Repeat dopplers in 2 years. Forward to PCP

## 2019-05-11 NOTE — Telephone Encounter (Signed)
-----   Message from Sueanne Margarita, MD sent at 05/11/2019 12:53 PM EST ----- I have forwarded the results to Dr. Leonie Man

## 2019-05-11 NOTE — Telephone Encounter (Addendum)
Repatha prior authorization approved, rx sent to local pharmacy and $5 copay card activated and sent too. Called pt to make him aware, he will call with concerns. F/u labs already scheduled.

## 2019-05-11 NOTE — Telephone Encounter (Signed)
Spoke with the pt and informed him of his carotid US results showing 1-39% bilateral carotid stenosis, and possible occlusion of the left vertebral artery.  Informed the pt that Dr. Radford Pax did route this result to his Neurologist, Dr. Leonie Man, for further review and follow-up.  Advised the pt that he should himself follow-up with Dr. Leonie Man about this.  Did ask the pt if he is experiencing any dizziness and vertigo, and he replied "no."  Informed the pt that Dr. Radford Pax would like for him to have a repeat carotid US done in 2 years.  Informed the pt that we will place the order in the system, and send a message to our [facility] and [facility] scheduler to follow-up and arrange this test for 2 years out. Pt verbalized understanding and agrees with this plan.  Pt did see Fuller [place] PharmD in lipid clinic today, for starting [place].  Pt will have repeat labs done at our office, on 07/12/2019.  We will recheck lipids and LFTs at that time.   Pts most recent lab results with lipids included are noted in Epic, under scanned labs, from his PCP Dr. Drema Dallas.  He had those labs done at his PCP on 04/11/19.  Will inform Dr. Radford Pax of this, and route this carotid results to his PCP, via Epic fax function.  Pt aware of this as well.   Pt verbalized understanding and agrees with this plan.

## 2019-05-15 ENCOUNTER — Telehealth: Payer: Self-pay

## 2019-05-15 NOTE — Telephone Encounter (Addendum)
Pt notified of results and verbalized understanding.  Result note from Dr .Danise Edge inform patient that cardiac echo was unremarkable

## 2019-05-22 ENCOUNTER — Ambulatory Visit
Admission: RE | Admit: 2019-05-22 | Discharge: 2019-05-22 | Disposition: A | Discharge status: Home / Self Care | Payer: 59 | Source: Ambulatory Visit | Attending: Neurology | Admitting: Neurology

## 2019-05-22 ENCOUNTER — Other Ambulatory Visit: Payer: Self-pay

## 2019-05-22 ENCOUNTER — Telehealth: Payer: Self-pay | Admitting: *Deleted

## 2019-05-22 DIAGNOSIS — I6381 Other cerebral infarction due to occlusion or stenosis of small artery: Secondary | ICD-10-CM | POA: Diagnosis not present

## 2019-05-22 MED ORDER — GADOBENATE DIMEGLUMINE 529 MG/ML IV SOLN
18.0000 mL | Freq: Once | INTRAVENOUS | Status: AC | PRN
  Administered 2019-05-22: 18 mL via INTRAVENOUS

## 2019-05-22 NOTE — Telephone Encounter (Signed)
**Note De-Identified Thomas Dudley Obfuscation** No answer and no way to leave a message as the pt has not set up his VM.  I noted on this in the pts chart on 05/04/2019.

## 2019-05-22 NOTE — Telephone Encounter (Signed)
Pt calling stating that his pharmacy was waiting on response from our office re: protonix rx and possible prior auth? I advised pt I would send message to [facility], LPN, that handles them for our office and ask her to give pt a call.

## 2019-05-29 ENCOUNTER — Telehealth: Payer: Self-pay

## 2019-05-29 NOTE — Telephone Encounter (Signed)
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

## 2019-05-29 NOTE — Telephone Encounter (Signed)
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  I called pt that the MRA neck shows no significant narrowing of the major blood vessels to worry about.PT verbalized understanding.  called patient that MR angiogram study of the brain was normal. Pt verbalized understanding.

## 2019-06-14 ENCOUNTER — Telehealth: Payer: Self-pay

## 2019-06-14 NOTE — Telephone Encounter (Signed)
I called [facility] that's patient Repatha was approve from 124/2020 to 05/03/2020.They stated medication does not need PA and pt pick up medication on 06/13/2019. The repatha was a paid claim.

## 2019-07-10 ENCOUNTER — Ambulatory Visit: Payer: 59 | Admitting: Neurology

## 2019-07-10 ENCOUNTER — Telehealth: Payer: Self-pay | Admitting: Neurology

## 2019-07-11 NOTE — Telephone Encounter (Signed)
error 

## 2019-07-12 ENCOUNTER — Other Ambulatory Visit: Payer: 59

## 2019-07-12 ENCOUNTER — Telehealth: Payer: Self-pay

## 2019-07-12 NOTE — Telephone Encounter (Signed)
Patient no show for appointment on 07/10/2019.

## 2019-07-18 ENCOUNTER — Telehealth: Payer: Self-pay | Admitting: Neurology

## 2019-07-18 NOTE — Telephone Encounter (Signed)
Pt called in today to reschedule his 2 month follow up that he missed on 07-10-19. Pt was in the hospital that day and that was why he missed his appointment. I rescheduled his appointment.

## 2019-08-07 ENCOUNTER — Other Ambulatory Visit: Payer: 59

## 2019-08-28 ENCOUNTER — Other Ambulatory Visit: Payer: Self-pay

## 2019-08-28 ENCOUNTER — Encounter (INDEPENDENT_AMBULATORY_CARE_PROVIDER_SITE_OTHER): Payer: Self-pay

## 2019-08-28 ENCOUNTER — Other Ambulatory Visit: Payer: 59 | Admitting: *Deleted

## 2019-08-28 DIAGNOSIS — E785 Hyperlipidemia, unspecified: Secondary | ICD-10-CM

## 2019-08-28 LAB — HEPATIC FUNCTION PANEL
ALT: 9 IU/L (ref 0–44)
AST: 13 IU/L (ref 0–40)
Albumin: 4.1 g/dL (ref 3.8–4.9)
Alkaline Phosphatase: 82 IU/L (ref 39–117)
Bilirubin Total: 0.3 mg/dL (ref 0.0–1.2)
Bilirubin, Direct: 0.13 mg/dL (ref 0.00–0.40)
Total Protein: 6.4 g/dL (ref 6.0–8.5)

## 2019-08-28 LAB — LIPID PANEL
Chol/HDL Ratio: 1.8 ratio (ref 0.0–5.0)
Cholesterol, Total: 83 mg/dL — ABNORMAL LOW (ref 100–199)
HDL: 46 mg/dL (ref >39)
LDL Chol Calc (NIH): 23 mg/dL (ref 0–99)
Triglycerides: 60 mg/dL (ref 0–149)
VLDL Cholesterol Cal: 14 mg/dL (ref 5–40)

## 2019-09-05 ENCOUNTER — Other Ambulatory Visit: Payer: Self-pay | Admitting: Neurology

## 2019-10-05 ENCOUNTER — Other Ambulatory Visit: Payer: Self-pay | Admitting: Neurology

## 2019-10-05 NOTE — Telephone Encounter (Signed)
This will not let me sign it

## 2019-10-12 ENCOUNTER — Telehealth: Payer: Self-pay | Admitting: Cardiology

## 2019-10-12 MED ORDER — REPATHA SURECLICK 140 MG/ML ~~LOC~~ SOAJ: 1.0000 "pen " | SUBCUTANEOUS | 3 refills | Status: DC

## 2019-10-12 NOTE — Telephone Encounter (Signed)
*  STAT* If patient is at the pharmacy, call can be transferred to refill team.   1. Which medications need to be refilled? (please list name of each medication and dose if known)  Evolocumab (REPATHA SURECLICK) 140 MG/ML SOAJ  2. Which pharmacy/location (including street and city if local pharmacy) is medication to be sent to? [id number] - [place],  - [address]  3. Do they need a 30 day or 90 day supply? 90 day supply   Patient is out of medication

## 2020-02-07 IMAGING — CT CT HEAD W/O CM
3 series · 15 of 47 positions shown, 18 images · non-contrast
Comparison: 10/06/2003

CLINICAL DATA: 1 week history of slurred speech.

EXAM:
CT HEAD WITHOUT CONTRAST
TECHNIQUE: Contiguous axial images were obtained from the base of the skull
through the vertex without intravenous contrast.

[Series 2: head wo · axial · 0.42mm/px · z∈[-140,-15]mm · 9 of 31 slices shown, 12 images]
[im 3/31  brain]
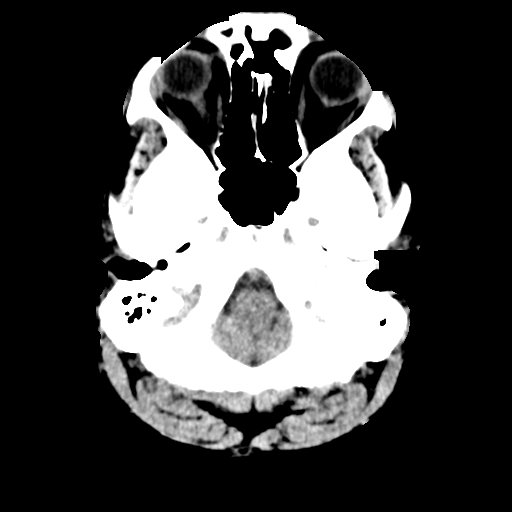
[im 3/31  bone]
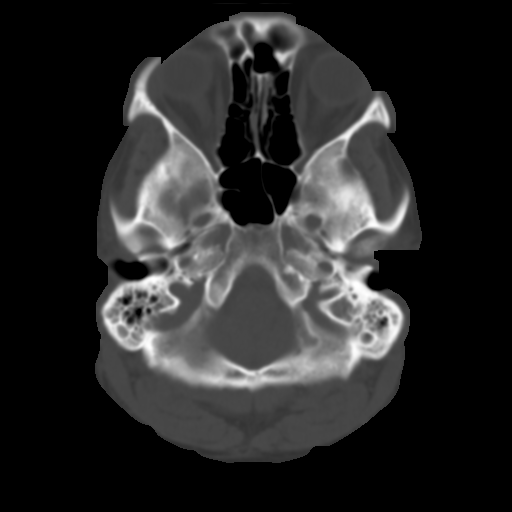
[im 6/31  brain]
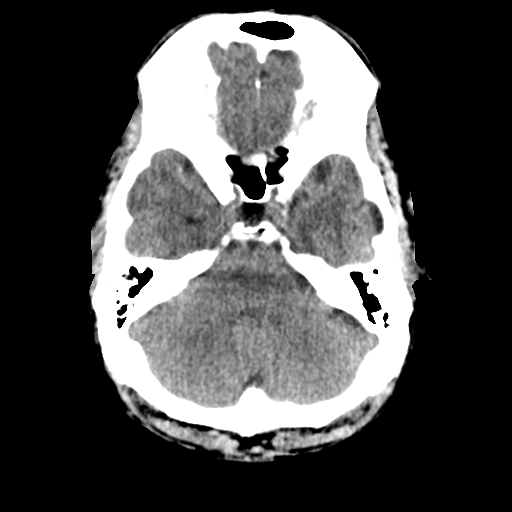
[im 9/31  brain]
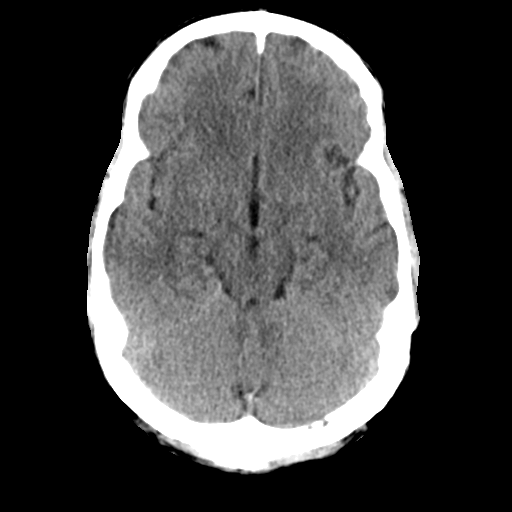
[im 12/31  brain]
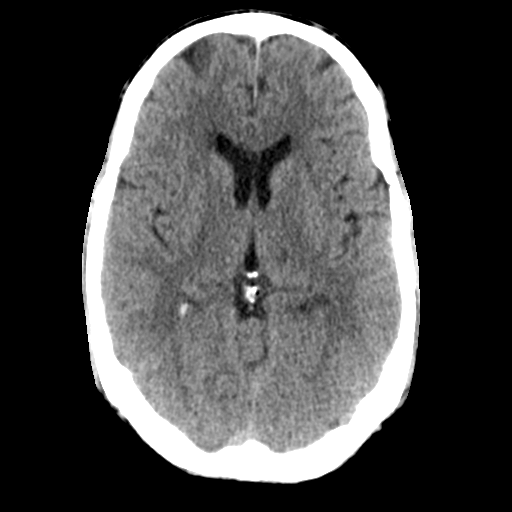
[im 16/31  brain]
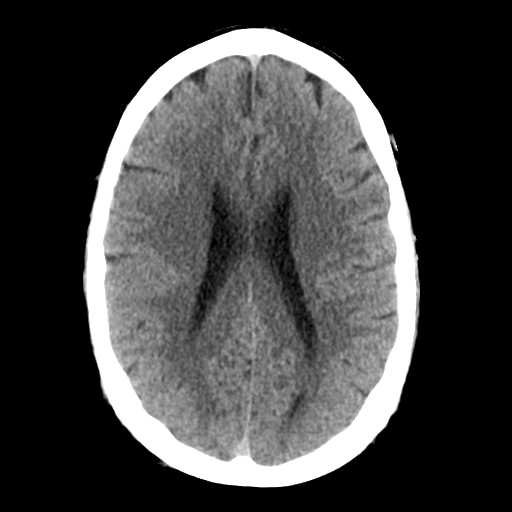
[im 16/31  bone]
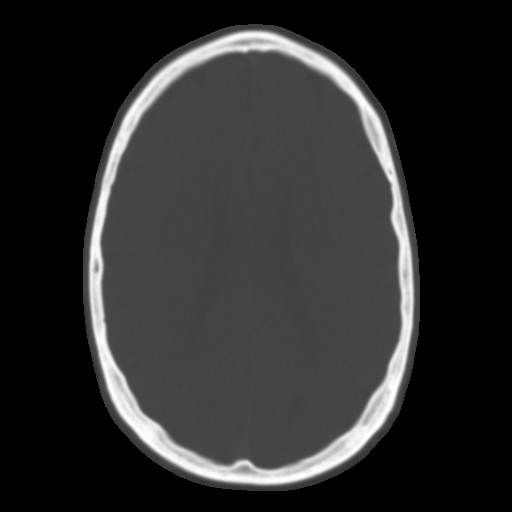
[im 19/31  brain]
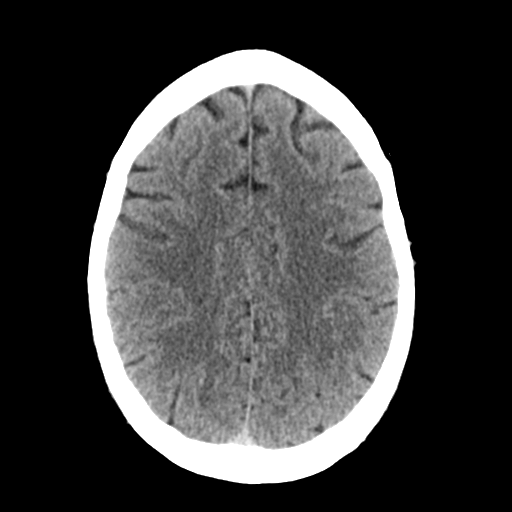
[im 22/31  brain]
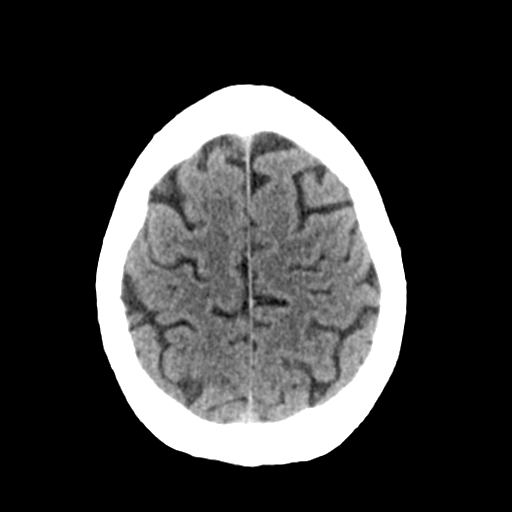
[im 25/31  brain]
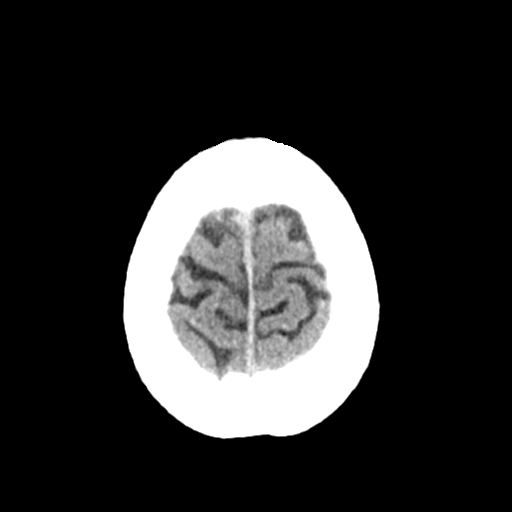
[im 28/31  brain]
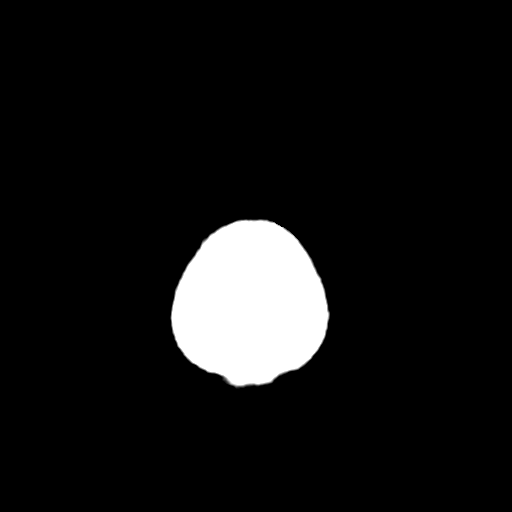
[im 28/31  bone]
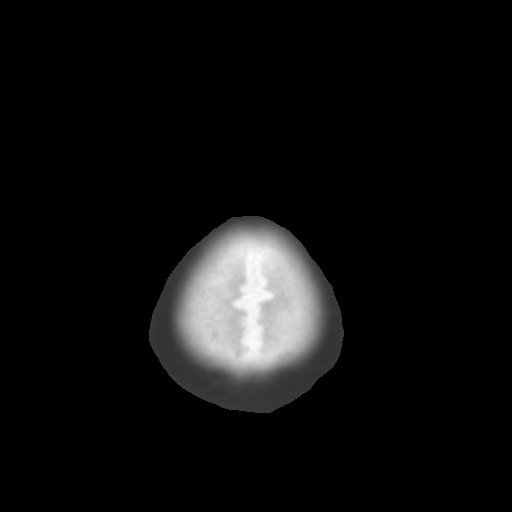

[Series 4: head wo coronal · coronal · 0.29mm/px · 3 of 75 slices shown]
[im 25/75  brain]
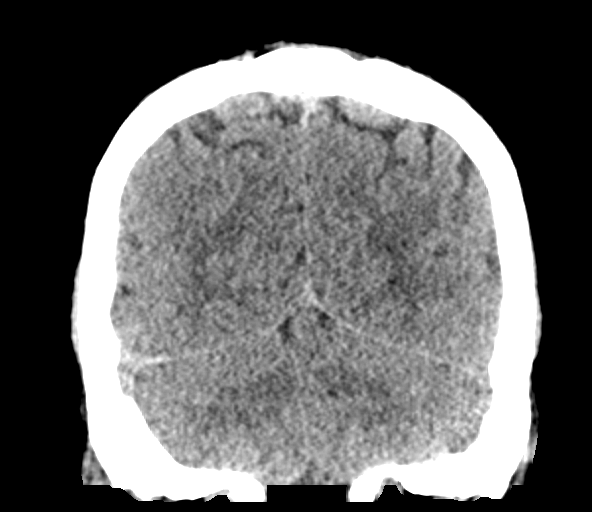
[im 33/75  brain]
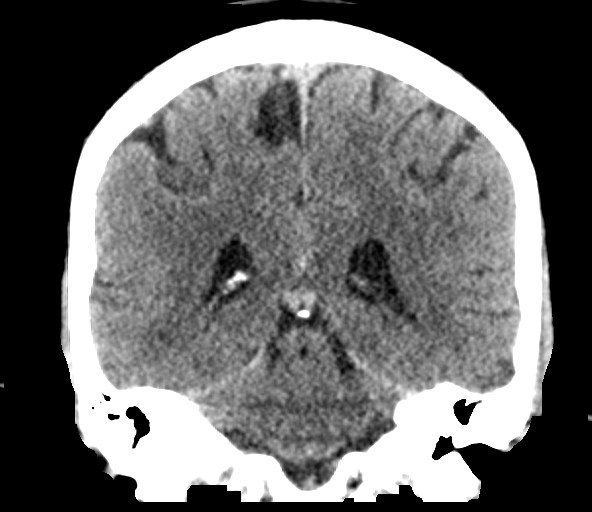
[im 42/75  brain]
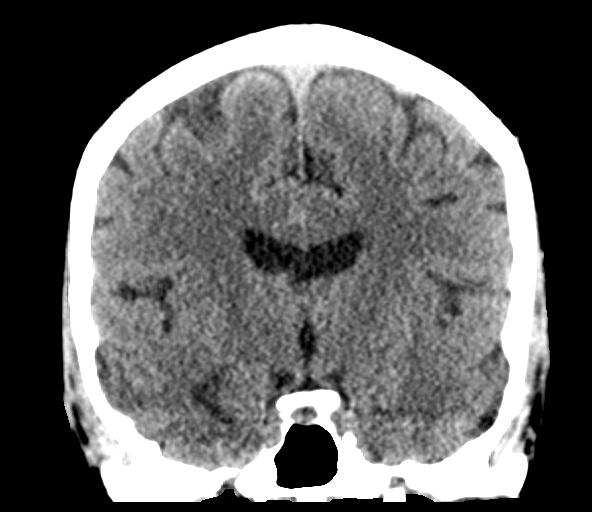

[Series 5: head wo sagittal · sagittal · 0.30mm/px · 3 of 54 slices shown]
[im 18/54  brain]
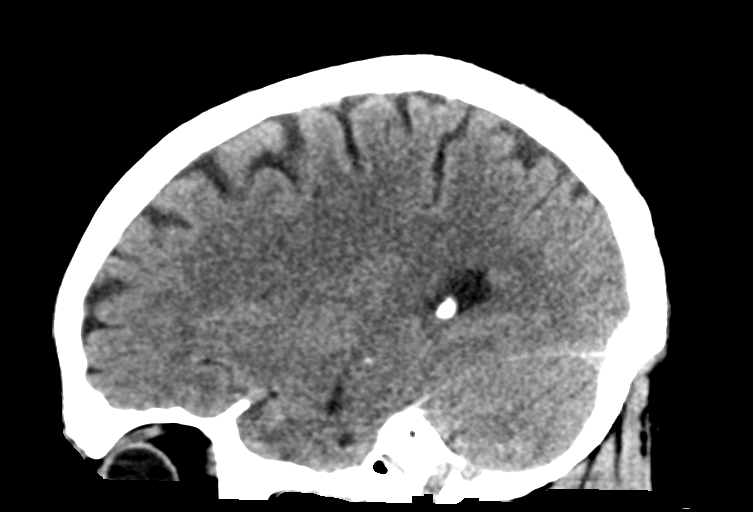
[im 27/54  brain]
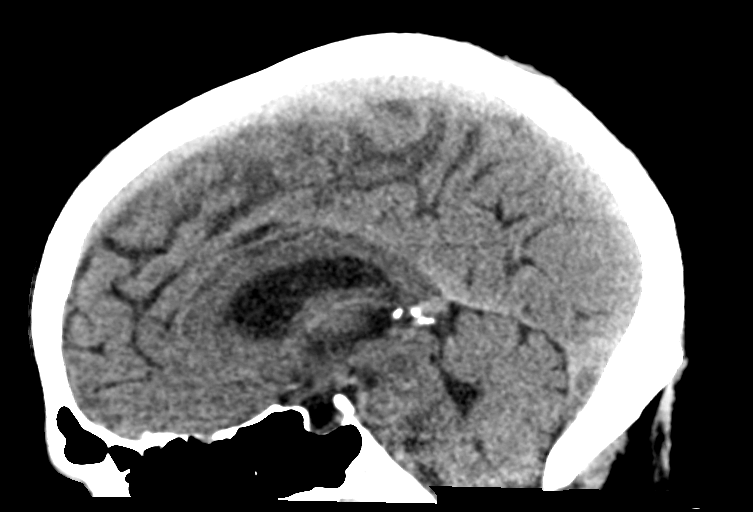
[im 36/54  brain]
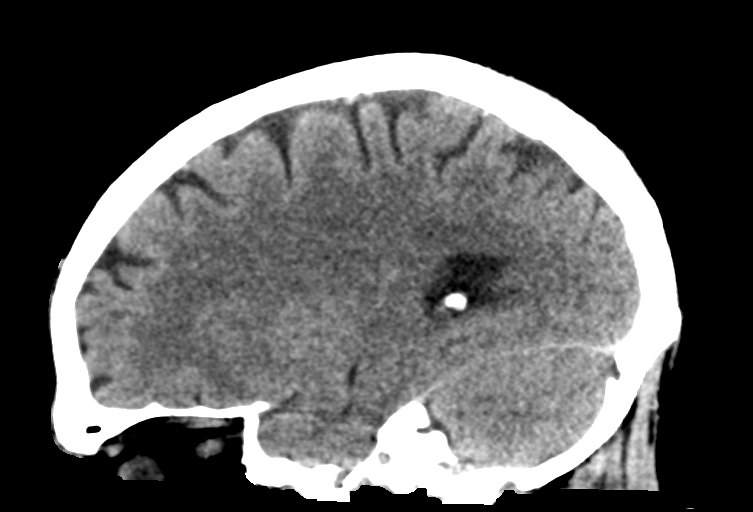

[15 of 47 positions shown; findings below may reference images not displayed]

FINDINGS: Brain: There is no evidence for acute hemorrhage, hydrocephalus,
mass lesion, or abnormal extra-axial fluid collection. No definite
CT evidence for acute infarction.

Vascular: No hyperdense vessel or unexpected calcification.

Skull: Normal. Negative for fracture or focal lesion.

Sinuses/Orbits: No acute finding.

Other: None.
IMPRESSION: Stable.  No acute intracranial abnormality.

## 2020-04-15 MED ORDER — REPATHA SURECLICK 140 MG/ML ~~LOC~~ SOAJ: 1.0000 "pen " | SUBCUTANEOUS | 3 refills | Status: DC

## 2020-04-15 NOTE — Addendum Note (Signed)
Addended by: Malena Peer D on: 04/15/2020 02:08 PM   Modules accepted: Orders

## 2020-05-15 ENCOUNTER — Other Ambulatory Visit: Payer: Self-pay | Admitting: Cardiology

## 2020-05-27 ENCOUNTER — Telehealth: Payer: Self-pay

## 2020-05-27 NOTE — Telephone Encounter (Signed)
Thomas Dudley (Key: [id number]) SUBMITTED

## 2020-05-27 NOTE — Telephone Encounter (Signed)
Mary from [facility] [phone]) is calling requesting that we do a PA for the pts Repatha. I will forward to our pharmacy team to address.

## 2020-06-06 ENCOUNTER — Other Ambulatory Visit: Payer: Self-pay

## 2020-06-06 MED ORDER — LISINOPRIL 40 MG PO TABS: ORAL_TABLET | ORAL | 0 refills | Status: DC

## 2020-06-06 NOTE — Telephone Encounter (Signed)
Pt's medication was sent to pt's pharmacy as requested. Confirmation received.  °

## 2020-06-14 ENCOUNTER — Other Ambulatory Visit: Payer: Self-pay | Admitting: Cardiology

## 2020-06-16 ENCOUNTER — Other Ambulatory Visit: Payer: Self-pay | Admitting: Cardiology

## 2020-06-17 ENCOUNTER — Encounter: Payer: Self-pay | Admitting: Cardiology

## 2020-06-17 NOTE — Telephone Encounter (Signed)
error 

## 2020-06-21 ENCOUNTER — Other Ambulatory Visit: Payer: Self-pay | Admitting: Cardiology

## 2020-07-02 ENCOUNTER — Other Ambulatory Visit: Payer: Self-pay | Admitting: Cardiology

## 2020-07-02 ENCOUNTER — Telehealth: Payer: Self-pay | Admitting: Cardiology

## 2020-07-02 NOTE — Telephone Encounter (Signed)
Pt c/o medication issue:  1. Name of Medication: carvedilol (COREG) 12.5 MG tablet metoprolol succinate (TOPROL-XL) 100 MG 24 hr tablet  2. How are you currently taking this medication (dosage and times per day)? N/A  3. Are you having a reaction (difficulty breathing--STAT)? No   4. What is your medication issue? Mandy with [employer] in [place] states they received prescriptions for both medications and she would like to know which one the patient should be taking.  Please return call to discuss at [phone]

## 2020-07-02 NOTE — Telephone Encounter (Signed)
Returned [employer] from [employer].  She has been advised, per Dr. Norris Cross last office note, pt is on Carvedilol and Toprol.

## 2020-07-05 ENCOUNTER — Ambulatory Visit: Payer: 59 | Admitting: Cardiology

## 2020-07-06 ENCOUNTER — Other Ambulatory Visit: Payer: Self-pay | Admitting: Cardiology

## 2020-07-12 ENCOUNTER — Other Ambulatory Visit: Payer: Self-pay | Admitting: Cardiology

## 2020-07-20 ENCOUNTER — Other Ambulatory Visit: Payer: Self-pay | Admitting: Cardiology

## 2020-07-23 ENCOUNTER — Ambulatory Visit: Payer: 59 | Admitting: Cardiology

## 2020-07-27 ENCOUNTER — Other Ambulatory Visit: Payer: Self-pay | Admitting: Cardiology

## 2020-07-28 ENCOUNTER — Other Ambulatory Visit: Payer: Self-pay | Admitting: Cardiology

## 2020-08-07 ENCOUNTER — Ambulatory Visit: Payer: 59 | Admitting: Cardiology

## 2020-08-07 ENCOUNTER — Encounter: Payer: Self-pay | Admitting: Cardiology

## 2020-08-07 ENCOUNTER — Other Ambulatory Visit: Payer: Self-pay

## 2020-08-07 VITALS — BP 134/70 | HR 62 | Ht 71.0 in | Wt 195.4 lb

## 2020-08-07 DIAGNOSIS — I2510 Atherosclerotic heart disease of native coronary artery without angina pectoris: Secondary | ICD-10-CM | POA: Diagnosis not present

## 2020-08-07 DIAGNOSIS — G4733 Obstructive sleep apnea (adult) (pediatric): Secondary | ICD-10-CM

## 2020-08-07 DIAGNOSIS — I10 Essential (primary) hypertension: Secondary | ICD-10-CM | POA: Diagnosis not present

## 2020-08-07 DIAGNOSIS — E785 Hyperlipidemia, unspecified: Secondary | ICD-10-CM | POA: Diagnosis not present

## 2020-08-07 DIAGNOSIS — I639 Cerebral infarction, unspecified: Secondary | ICD-10-CM

## 2020-08-07 NOTE — Progress Notes (Signed)
Cardiology Office Note:    Date:  08/07/2020   ID:  [name], DOB 09/22/1960, MRN [id number]  PCP:  Juluis Rainier, MD  Cardiologist:  Armanda Magic, MD    Referring MD: Juluis Rainier, MD   Chief Complaint  Patient presents with  . Coronary Artery Disease  . Hypertension  . Hyperlipidemia  . Sleep Apnea    History of Present Illness:    Thomas Dudley is a 60 y.o. male with a hx  of HTN, OSA, dyslipidemia and CAD in the setting of STEMI with PCI ofobtuse marginal in 2004 with no nonobstructive disease in other territories. Repeatcath 04/2008 with PCI of OM2, PDA and distal RCA. S/pSTEMI with PCI of right PL and normal LVF 08/2012 in [place]. He also has a history of OSA, HTN, PVD, tobacco abuse and dyslipidemia.   He is here today for followup and is doing well.  He denies any chest pain or pressure, SOB, DOE, PND, orthopnea, LE edema, dizziness, palpitations or syncope. He is compliant with his meds and is tolerating meds with no SE.    He is doing well with his CPAP device and thinks that he has gotten used to it.  He tolerates the mask and feels the pressure is adequate.  Since going on CPAP he feels rested in the am and has no significant daytime sleepiness.  He denies any significant mouth or nasal dryness or nasal congestion.  He does not think that he snores.     Past Medical History:  Diagnosis Date  . Allergic rhinitis   . Arthritis   . Back pain   . Coronary atherosclerosis of native coronary artery    post PCI of obtuse marginal in 2004 with no nonobstructive disease in other territories, cath 04/2008 PCI of OM2, PDA and distal RCA, s/p STEMI with PCI of right PL and normal LVF 08/2012 in [place]  . Depression    takes Wellbutrin daily  . Diverticulitis   . Gastritis   . GERD (gastroesophageal reflux disease)    takes Protonix daily  . Hemorrhage of rectum and anus   . History of colon polyps   . HTN (hypertension)    takes  Lisinopril and Metoprolol daily  . Hypercholesteremia    takes Lipitora daily  . Insomnia    takes Ambien nightly  . Insomnia   . Joint pain   . Myocardial infarction (HCC) 09/10/12  . OSA (obstructive sleep apnea)    sleep study done > 75yrs ago;uses CPAP  . Pneumonia    hx of ;about 29yrs ago  . PVD (peripheral vascular disease) (HCC)   . Tobacco abuse     Past Surgical History:  Procedure Laterality Date  . ADENOIDECTOMY    . CARDIAC CATHETERIZATION     multiple  . CARPAL TUNNEL RELEASE Right   . COLONOSCOPY    . CORONARY ANGIOPLASTY     total of 6 stents   . KNEE ARTHROSCOPY Left 09/22/2013   Procedure: LEFT KNEE ARTHROSCOPY ;  Surgeon: Verlee Rossetti, MD;  Location: [facility] OR;  Service: Orthopedics;  Laterality: Left;  . NASAL SEPTOPLASTY W/ TURBINOPLASTY    . TONSILLECTOMY      Current Medications: No outpatient medications have been marked as taking for the 08/07/20 encounter (Office Visit) with Quintella Reichert, MD.     Allergies:   Neomycin-bacitracin zn-polymyx   Social History   Socioeconomic History  . Marital status: Married  Spouse name: Not on file  . Number of children: Not on file  . Years of education: Not on file  . Highest education level: Not on file  Occupational History  . Not on file  Tobacco Use  . Smoking status: Current Every Day Smoker    Packs/day: 1.00    Years: 35.00    Pack years: 35.00  . Smokeless tobacco: Never Used  Substance and Sexual Activity  . Alcohol use: Yes    Alcohol/week: 0.0 standard drinks    Comment: 6pk every 6 months  . Drug use: No  . Sexual activity: Yes  Other Topics Concern  . Not on file  Social History Narrative  . Not on file   Social Determinants of Health   Financial Resource Strain: Not on file  Food Insecurity: Not on file  Transportation Needs: Not on file  Physical Activity: Not on file  Stress: Not on file  Social Connections: Not on file     Family History: The patient's family history  includes Breast cancer in his mother; Heart disease in his mother; Lung cancer in his father.  ROS:   Please see the history of present illness.    ROS  All other systems reviewed and negative.   EKGs/Labs/Other Studies Reviewed:    The following studies were reviewed today: 2D echo 06/2018 IMPRESSIONS   1. Left ventricular ejection fraction, by visual estimation, is 55 to  60%. The left ventricle has normal function. There is mildly increased  left ventricular hypertrophy.  2. The left ventricle has no regional wall motion abnormalities.  3. Left ventricular ejection fraction by 3D volume is is 58 %.  4. Global right ventricle has normal systolic function.The right  ventricular size is normal. No increase in right ventricular wall  thickness.  5. Left atrial size was normal.  6. Right atrial size was normal.  7. The mitral valve is normal in structure. Mild mitral valve  regurgitation.  8. The tricuspid valve is normal in structure. Tricuspid valve  regurgitation is not demonstrated.  9. The aortic valve is tricuspid. Aortic valve regurgitation is mild. No  evidence of aortic valve sclerosis or stenosis.  10. The pulmonic valve was not well visualized. Pulmonic valve  regurgitation is not visualized.  11. The tricuspid regurgitant velocity is 2.20 m/s, and with an assumed  right atrial pressure of 3 mmHg, the estimated right ventricular systolic  pressure is normal at 22.4 mmHg.  12. The inferior vena cava is normal in size with greater than 50%  respiratory variability, suggesting right atrial pressure of 3 mmHg.   Tollie Eth 05/2019 Study Highlights   Nuclear stress EF: 49%. The left ventricular ejection fraction is mildly decreased (45-54%).  There was no ST segment deviation noted during stress.  This is a low risk study. There is no evidence of previous infarction or evidence of ischemia.  The study is normal.  EKG:  EKG is not ordered today.     Recent Labs: 08/28/2019: ALT 9   Recent Lipid Panel    Component Value Date/Time   CHOL 83 (L) 08/28/2019 0730   TRIG 60 08/28/2019 0730   HDL 46 08/28/2019 0730   CHOLHDL 1.8 08/28/2019 0730   CHOLHDL 3.1 06/12/2015 0821   VLDL 23 06/12/2015 0821   LDLCALC 23 08/28/2019 0730    Physical Exam:    VS:  There were no vitals taken for this visit.    Wt Readings from Last 3 Encounters:  05/11/19 195 lb 12 oz (88.8 kg)  05/11/19 201 lb (91.2 kg)  05/03/19 201 lb 12.8 oz (91.5 kg)     GEN:  Well nourished, well developed in no acute distress HEENT: Normal NECK: No JVD; No carotid bruits LYMPHATICS: No lymphadenopathy CARDIAC: RRR, no murmurs, rubs, gallops RESPIRATORY:  Clear to auscultation without rales, wheezing or rhonchi  ABDOMEN: Soft, non-tender, non-distended MUSCULOSKELETAL:  No edema; No deformity  SKIN: Warm and dry NEUROLOGIC:  Alert and oriented x 3 PSYCHIATRIC:  Normal affect   ASSESSMENT:    1. Coronary artery disease involving native coronary artery of native heart without angina pectoris   2. Primary hypertension   3. OSA (obstructive sleep apnea)   4. Dyslipidemia, goal LDL below 70   5. Cerebrovascular accident (CVA), unspecified mechanism (HCC)    PLAN:    In order of problems listed above:  1.  ASCAD -s/p remote STEMI with PCI ofobtuse marginal in 2004 with nonobstructive disease in other territories. Repeatcath 04/2008 with PCI of OM2, PDA and distal RCA. S/pSTEMI with PCI of right PL and normal LVF 08/2012 in [facility] [place] -at last OV he had CP and nuclear stress test showed no ischemia -continue ASA, Plavix 75mg  daily, BB and statin  2.  HTN -BP controlled on exam today -continue amlodipine 10mg  daily, Carvedilol 12.5mg  BID, Lisnipril 40mg  daily and Toprol XL 100mg  daily -SCr stable at 0.88 and K+ 4  3.  OSA - The patient is tolerating PAP therapy well without any problems. The PAP download was reviewed today and showed an AHI of  2.5/hr on 17 cm H2O with 93% compliance in using more than 4 hours nightly.  The patient has been using and benefiting from PAP use and will continue to benefit from therapy.   4.  HLD -LDL goal < 70 -LDL was 10 on 07/23/2020 -continue Atorvastatin 80mg  daily and Repatha  5.  Possible recent lacunar infarct/TIA -followed by Dr. with Neuro -initially thought to be due to hypertensive crisis after stopping meds -Head MRI showed chronic left thalamic lacunar ischemic infarct but no new findings since 2017 and normal MRA of head and MRA of neck -continue ASA 81mg  daily and Plavix 75mg  daily -2D echo showed normal LVF with mild MR and AR  Medication Adjustments/Labs and Tests Ordered: Current medicines are reviewed at length with the patient today.  Concerns regarding medicines are outlined above.  No orders of the defined types were placed in this encounter.  No orders of the defined types were placed in this encounter.   Signed, , MD  08/07/2020 3:48 PM    [facility]

## 2020-08-07 NOTE — Patient Instructions (Signed)

## 2020-08-14 ENCOUNTER — Other Ambulatory Visit: Payer: Self-pay | Admitting: Cardiology

## 2020-08-26 ENCOUNTER — Other Ambulatory Visit: Payer: Self-pay | Admitting: Cardiology

## 2020-11-22 ENCOUNTER — Other Ambulatory Visit: Payer: Self-pay | Admitting: Cardiology

## 2021-04-21 ENCOUNTER — Other Ambulatory Visit: Payer: Self-pay | Admitting: Cardiology

## 2021-05-12 ENCOUNTER — Telehealth: Payer: Self-pay | Admitting: Cardiology

## 2021-05-12 MED ORDER — NITROGLYCERIN 0.4 MG SL SUBL: 0.4000 mg | SUBLINGUAL_TABLET | SUBLINGUAL | 0 refills | Status: DC | PRN

## 2021-05-12 NOTE — Telephone Encounter (Signed)
*  STAT* If patient is at the pharmacy, call can be transferred to refill team.   1. Which medications need to be refilled? (please list name of each medication and dose if known) nitroGLYCERIN (NITROSTAT) 0.4 MG SL tablet  2. Which pharmacy/location (including street and city if local pharmacy) is medication to be sent to? [id number] - [place], [place] - [address]  3. Do they need a 30 day or 90 day supply? 90 day supply

## 2021-05-12 NOTE — Telephone Encounter (Signed)
Pt's medication was sent to pt's pharmacy as requested. Confirmation received.  °

## 2021-05-13 ENCOUNTER — Ambulatory Visit (HOSPITAL_COMMUNITY)
Admission: RE | Admit: 2021-05-13 | Discharge: 2021-05-13 | Disposition: A | Discharge status: Home / Self Care | Payer: 59 | Source: Ambulatory Visit | Attending: Cardiology | Admitting: Cardiology

## 2021-05-13 ENCOUNTER — Other Ambulatory Visit: Payer: Self-pay

## 2021-05-13 DIAGNOSIS — I6523 Occlusion and stenosis of bilateral carotid arteries: Secondary | ICD-10-CM | POA: Insufficient documentation

## 2021-05-15 ENCOUNTER — Telehealth: Payer: Self-pay

## 2021-05-15 DIAGNOSIS — G4733 Obstructive sleep apnea (adult) (pediatric): Secondary | ICD-10-CM

## 2021-05-15 NOTE — Telephone Encounter (Signed)
Spoke with the patient in regards to his ultrasound results. Patient verbalized understanding.  Patient would like to know if the Inspire device would be something that he could get. Advised patient that I am not sure that he would qualify for the device as his AHI according to last office visit note was 2.5. He would like to know if he does not qualify for the Inspire would he be able to get a new CPAP machine as his is very old. Advised that I would send a message to Dr. Mayford Knife and Coralee North for further advisement on new machine.

## 2021-05-16 NOTE — Telephone Encounter (Signed)
I s/w the pt in regard to a home sleep study, (Itamar). Pt is agreeable to come in 05/20/21 @ 4:30. Pt aware to stop at check I desk and let them know he is here to see Okey Regal who does the home sleep studies. Pt thanked me for the call and the help. I will send a message to our sleep pool to check for approval from insurance.

## 2021-05-16 NOTE — Telephone Encounter (Signed)
Spoke with the patient and advised that he would need a home sleep study to determine if he qualifies for the [place] device. Patient is agreeable. Itamar home sleep study has been ordered.

## 2021-05-20 ENCOUNTER — Telehealth: Payer: Self-pay | Admitting: Cardiology

## 2021-05-20 NOTE — Telephone Encounter (Signed)
Patient called to say that he woudnt be able to pick the cpap machine today. He will need to come Thursday around 4/430. Please advise

## 2021-05-21 NOTE — Telephone Encounter (Signed)
I s/w the pt today. Pt states that he called yesterday and left a message for me that he was not going to be able to come in yesterday 05/20/21. Pt will come in tomorrow 05/22/21. I advised the pt that I am not going to be in the office. Advised he will need to ask to s/w Tresa Endo, [place] or Garner Gavel. to set up sleep study with him. I will let the front desk know that the pt is going to come in and ask to see Tresa Endo, [place] or Garner Gavel. Pt thanked me for the help. I apologized that no-one let me know he called yesterday.   I have already sent a message to the sleep pool on 05/16/21 to see if they could check on pre cert, hoping that we will have pre cert by the time pt comes in for Itamar set up. I have not heard if yet approved.

## 2021-05-21 NOTE — Telephone Encounter (Signed)
I will forward to [place], [place] and Garner Gavel. as Thomas Dudley.

## 2021-05-22 NOTE — Telephone Encounter (Signed)
Patient Name: Thomas Dudley        DOB: 2061/03/31      Height:     Weight:  Office Name:         Referring Provider:  Today's Date:  Date:   STOP BANG RISK ASSESSMENT S (snore) Have you been told that you snore?     YES/NO yes  T (tired) Are you often tired, fatigued, or sleepy during the day?   YES/NO yes  O (obstruction) Do you stop breathing, choke, or gasp during sleep? YES/NO yes  P (pressure) Do you have or are you being treated for high blood pressure? YES/NO yes  B (BMI) Is your body index greater than 35 kg/m? YES/NO no  A (age) Are you 38 years old or older? YES/NO yes  N (neck) Do you have a neck circumference greater than 16 inches?   YES/NO yes  G (gender) Are you a male? YES/NO yes  TOTAL STOP/BANG YES ANSWERS                                                                        For Office Use Only              Procedure Order Form    YES to 3+ Stop Bang questions OR two clinical symptoms - patient qualifies for WatchPAT (CPT 95800)             Clinical Notes: Will consult Sleep Specialist and refer for management of therapy due to patient increased risk of Sleep Apnea. Ordering a sleep study due to the following two clinical symptoms: Excessive daytime sleepiness G47.10 / Gastroesophageal reflux K21.9 / Nocturia R35.1 / Morning Headaches G44.221 / Difficulty concentrating R41.840 / Memory problems or poor judgment G31.84 / Personality changes or irritability R45.4 / Loud snoring R06.83 / Depression F32.9 / Unrefreshed by sleep G47.8 / Impotence N52.9 / History of high blood pressure R03.0 / Insomnia G47.00    I understand that I am proceeding with a home sleep apnea test as ordered by my treating physician. I understand that untreated sleep apnea is a serious cardiovascular risk factor and it is my responsibility to perform the test and seek management for sleep apnea. I will be contacted with the results and be managed for sleep apnea by a local sleep  physician. I will be receiving equipment and further instructions from [facility]. I shall promptly ship back the equipment via the included mailing label. I understand my insurance will be billed for the test and as the patient I am responsible for any insurance related out-of-pocket costs incurred. I have been provided with written instructions and can call for additional video or telephonic instruction, with 24-hour availability of qualified personnel to answer any questions: Patient Help Desk [phone].  Patient Signature ______________________________________________________   Date______________________ Patient Telemedicine Verbal Consent

## 2021-05-22 NOTE — Telephone Encounter (Signed)
Patient Name:         DOB:       Height:     Weight:  Office Name:         Referring Provider:  Today's Date:  Date:   STOP BANG RISK ASSESSMENT S (snore) Have you been told that you snore?     YES/NO yes  T (tired) Are you often tired, fatigued, or sleepy during the day?   YES/NO yes  O (obstruction) Do you stop breathing, choke, or gasp during sleep? YES/NO yes  P (pressure) Do you have or are you being treated for high blood pressure? YES/NO yes  B (BMI) Is your body index greater than 35 kg/m? YES/NO   A (age) Are you 10 years old or older? YES/NO yes  N (neck) Do you have a neck circumference greater than 16 inches?   YES/NO   G (gender) Are you a male? YES/NO   TOTAL STOP/BANG YES ANSWERS                                                                        For Office Use Only              Procedure Order Form    YES to 3+ Stop Bang questions OR two clinical symptoms - patient qualifies for WatchPAT (CPT 95800)             Clinical Notes: Will consult Sleep Specialist and refer for management of therapy due to patient increased risk of Sleep Apnea. Ordering a sleep study due to the following two clinical symptoms: Excessive daytime sleepiness G47.10 / Gastroesophageal reflux K21.9 / Nocturia R35.1 / Morning Headaches G44.221 / Difficulty concentrating R41.840 / Memory problems or poor judgment G31.84 / Personality changes or irritability R45.4 / Loud snoring R06.83 / Depression F32.9 / Unrefreshed by sleep G47.8 / Impotence N52.9 / History of high blood pressure R03.0 / Insomnia G47.00    I understand that I am proceeding with a home sleep apnea test as ordered by my treating physician. I understand that untreated sleep apnea is a serious cardiovascular risk factor and it is my responsibility to perform the test and seek management for sleep apnea. I will be contacted with the results and be managed for sleep apnea by a local sleep physician. I will be receiving  equipment and further instructions from [facility]. I shall promptly ship back the equipment via the included mailing label. I understand my insurance will be billed for the test and as the patient I am responsible for any insurance related out-of-pocket costs incurred. I have been provided with written instructions and can call for additional video or telephonic instruction, with 24-hour availability of qualified personnel to answer any questions: Patient Help Desk [phone].  Patient Signature ______________________________________________________   Date______________________ Patient Telemedicine Verbal Consent

## 2021-05-22 NOTE — Telephone Encounter (Signed)
Pt came in for Itamar sleep study.  Computer kept going in and out, so had to try the Stopbang 2 times. Score was 7.  Will await precert, as message has already been sent by Coy Saunas., see below.

## 2021-05-24 ENCOUNTER — Other Ambulatory Visit: Payer: Self-pay | Admitting: Cardiology

## 2021-05-28 NOTE — Telephone Encounter (Signed)
Patient is suppose to pick up a [name] will contact him or someone covering for Okey Regal about when to come to the office for his pick up.

## 2021-05-28 NOTE — Telephone Encounter (Signed)
Prior Authorization for [employer] sent to [employer] via Phone. Reference # NO PA REQUIRED PER WENDY C. 05/28/21 at 2:42

## 2021-05-29 NOTE — Telephone Encounter (Signed)
S/w the pt and he has been given the PIN # [id number] and ok to proceed with sleep study. Pt advised to complete sleep study within the next 2 weeks. Pt states he will do sleep study this weekend. Pt thanked me for the call today and the help.

## 2021-05-29 NOTE — Telephone Encounter (Signed)
Left message for the pt to call back so that we may give the pt the PIN# for [facility]. Left message to ask to s/w Okey Regal who does the home sleep study.

## 2021-06-09 ENCOUNTER — Telehealth: Payer: Self-pay | Admitting: *Deleted

## 2021-06-09 NOTE — Telephone Encounter (Signed)
Pt is wanting to notify you that he has yet to do the sleep study... please advise

## 2021-06-09 NOTE — Telephone Encounter (Signed)
Pa for repatha submitted to plan Thomas Dudley (Key: [id number]) Will route to a chst pharmd to offer sample since I do not know the status of samples in that office.

## 2021-06-09 NOTE — Telephone Encounter (Signed)
Prior authorization response should come within a few days, should not need sample. Please call pt once PA is approved, can send in refill at that time. If PA is denied, can sample then.

## 2021-06-09 NOTE — Telephone Encounter (Signed)
The pt called the office today and left message with the operator to let me know he haas not yet done sleep study:  [place], [name] 22 minutes ago (10:29 AM)   Thomas Dudley Pt is wanting to notify you that he has yet to do the sleep study... please advise       Note    Neymar, [name] [phone]  [place], [name]   I called the pt back and s/w the pt. Pt states his wife has Covid right now and he has been taking care of her. He states he has been getting up quite a bit during the night caring for his wife now. He did not want to mess up the sleep study per the pt. I did ask the pt would be able to complete Itamar Sleep study by Friday this week 06/13/21. Pt answered yes.   Pt did state he is supposed to be on Repatha but says his insurance does not want to pay for it. I explained to the pt that I will have one of the Pharm-D's in the office follow up and call him. Pt said thank you for the help.

## 2021-06-09 NOTE — Telephone Encounter (Signed)
I was speaking with the pt about a sleep study when pt tells me about Repatha, see notes below.  Pt did state he is supposed to be on Repatha but says his insurance does not want to pay for it. I explained to the pt that I will have one of the Pharm-D's in the office follow up and call him. Pt said thank you for the help.

## 2021-06-09 NOTE — Telephone Encounter (Signed)
Lmom that the repatha was approved until 06/09/22

## 2021-06-09 NOTE — Telephone Encounter (Signed)
See notes from today

## 2021-06-19 NOTE — Telephone Encounter (Signed)
I s/w the pt today to follow up on the sleep study. Pt states his wife is feeling better now after having covid, but her had a death in his family the other day and he had to go to [place] for the funeral. I offered our condolences on his loss. Pt states he will do the sleep study this weekend. I thanked the pt for his help today and we will keep an eye out for results.

## 2021-06-21 ENCOUNTER — Encounter (INDEPENDENT_AMBULATORY_CARE_PROVIDER_SITE_OTHER): Payer: 59 | Admitting: Cardiology

## 2021-06-21 DIAGNOSIS — G4733 Obstructive sleep apnea (adult) (pediatric): Secondary | ICD-10-CM | POA: Diagnosis not present

## 2021-06-22 ENCOUNTER — Ambulatory Visit: Payer: 59

## 2021-06-22 DIAGNOSIS — G4733 Obstructive sleep apnea (adult) (pediatric): Secondary | ICD-10-CM

## 2021-06-22 NOTE — Procedures (Signed)
° °  Sleep Study Report  Patient Information Study Date: 06/21/21 Patient Name: Thomas Dudley Patient ID: [id number] Birth Date: 09/15/2060 Age: 61 Gender: Male BMI: 27.5 (W=196 lb, H=5' 11'') Referring Physician: Armanda Magic, MD  TEST DESCRIPTION: Home sleep apnea testing was completed using the WatchPat, a Type 1 device, utilizing peripheral arterial tonometry (PAT), chest movement, actigraphy, pulse oximetry, pulse rate, body position and snore. AHI was calculated with apnea and hypopnea using valid sleep time as the denominator. RDI includes apneas, hypopneas, and RERAs. The data acquired and the scoring of sleep and all associated events were performed in accordance with the recommended standards and specifications as outlined in the AASM Manual for the Scoring of Sleep and Associated Events 2.2.0 (2015).  FINDINGS: 1. Mild Obstructive Sleep Apnea with AHI 8.2/hr. 2. No Central Sleep Apnea with pAHIc 2.8/hr. 3. Oxygen desaturations as low as 64%. 4. Moderate snoring was present. O2 sats were < 88% for 0.7 min. 5. Total sleep time was 6 hrs and 29 min. 6. 30.5% of total sleep time was spent in REM sleep. 7. Normal sleep onset latency at 20 min. 8. Normal REM sleep onset latency at 82 min. 9. Total awakenings were 11.  DIAGNOSIS: Mild Obstructive Sleep Apnea (G47.33)  RECOMMENDATIONS: 1. Clinical correlation of these findings is necessary. The decision to treat obstructive sleep apnea (OSA) is usually based on the presence of apnea symptoms or the presence of associated medical conditions such as Hypertension, Congestive Heart Failure, Atrial Fibrillation or Obesity. The most common symptoms of OSA are snoring, gasping for breath while sleeping, daytime sleepiness and fatigue.  2. Initiating apnea therapy is recommended given the presence of symptoms and/or associated conditions. Recommend proceeding with one of the following:   a. Auto-CPAP therapy with a pressure range of  5-20cm H2O.   b. An oral appliance (OA) that can be obtained from certain dentists with expertise in sleep medicine. These are primarily of use in non-obese patients with mild and moderate disease.   c. An ENT consultation which may be useful to look for specific causes of obstruction and possible treatment options.   d. If patient is intolerant to PAP therapy, consider referral to ENT for evaluation for hypoglossal nerve stimulator.  3. Close follow-up is necessary to ensure success with CPAP or oral appliance therapy for maximum benefit .  4. A follow-up oximetry study on CPAP is recommended to assess the adequacy of therapy and determine the need for supplemental oxygen or the potential need for Bi-level therapy. An arterial blood gas to determine the adequacy of baseline ventilation and oxygenation should also be considered.  5. Healthy sleep recommendations include: adequate nightly sleep (normal 7-9 hrs/night), avoidance of caffeine after noon and alcohol near bedtime, and maintaining a sleep environment that is cool, dark and quiet.  6. Weight loss for overweight patients is recommended. Even modest amounts of weight loss can significantly improve the severity of sleep apnea.  7. Snoring recommendations include: weight loss where appropriate, side sleeping, and avoidance of alcohol before bed.  8. Operation of motor vehicle should be avoided when sleepy.  Signature: Electronically Signed: 06/22/21 Armanda Magic, MD; [facility]; Diplomat, American Board of Sleep Medicine

## 2021-06-23 NOTE — Telephone Encounter (Signed)
Seems sleep study has been completed and read by the cardiologist.

## 2021-06-30 ENCOUNTER — Other Ambulatory Visit: Payer: Self-pay | Admitting: Cardiology

## 2021-07-01 ENCOUNTER — Telehealth: Payer: Self-pay | Admitting: Cardiology

## 2021-07-01 MED ORDER — REPATHA SURECLICK 140 MG/ML ~~LOC~~ SOAJ: SUBCUTANEOUS | 3 refills | Status: DC

## 2021-07-01 NOTE — Telephone Encounter (Signed)
*  STAT* If patient is at the pharmacy, call can be transferred to refill team.   1. Which medications need to be refilled? (please list name of each medication and dose if known)  REPATHA SURECLICK 140 MG/ML SOAJ  2. Which pharmacy/location (including street and city if local pharmacy) is medication to be sent to? [id number] - [place], [place] - [address]  3. Do they need a 30 day or 90 day supply?   30 day supply  Patient states he has been out of medication for 1 month.

## 2021-07-02 ENCOUNTER — Telehealth: Payer: Self-pay | Admitting: *Deleted

## 2021-07-02 NOTE — Telephone Encounter (Signed)
The patient has been notified of the result and verbalized understanding.  All questions (if any) were answered. Thomas Dudley, CMA 07/02/2021 5:13 PM    Upon patient request DME selection is [facility] Patient understands he will be contacted by [facility] to set up his cpap. Patient understands to call if [facility] does not contact him with new setup in a timely manner. Patient understands they will be called once confirmation has been received from [facility]/ that they have received their new machine to schedule 10 week follow up appointment.   [facility] notified of new cpap order  Please add to airview Patient was grateful for the call and thanked me

## 2021-07-02 NOTE — Telephone Encounter (Signed)
-----   Message from Quintella Reichert, MD sent at 06/22/2021  8:56 AM EST ----- Order ResMed CPAP on auto from 4 to 15cm H2O with heated humidity, mask of choice  Followup in 6 weeks with me.

## 2021-09-03 ENCOUNTER — Other Ambulatory Visit: Payer: Self-pay | Admitting: Cardiology

## 2021-10-14 ENCOUNTER — Ambulatory Visit: Payer: 59 | Admitting: Cardiology

## 2021-10-27 ENCOUNTER — Other Ambulatory Visit: Payer: Self-pay | Admitting: Cardiology

## 2021-10-28 ENCOUNTER — Encounter: Payer: Self-pay | Admitting: Cardiology

## 2021-10-28 ENCOUNTER — Ambulatory Visit (INDEPENDENT_AMBULATORY_CARE_PROVIDER_SITE_OTHER): Payer: 59 | Admitting: Cardiology

## 2021-10-28 VITALS — BP 126/70 | HR 58 | Ht 71.0 in | Wt 192.2 lb

## 2021-10-28 DIAGNOSIS — I2510 Atherosclerotic heart disease of native coronary artery without angina pectoris: Secondary | ICD-10-CM

## 2021-10-28 DIAGNOSIS — I10 Essential (primary) hypertension: Secondary | ICD-10-CM

## 2021-10-28 DIAGNOSIS — E785 Hyperlipidemia, unspecified: Secondary | ICD-10-CM | POA: Diagnosis not present

## 2021-10-28 DIAGNOSIS — G4733 Obstructive sleep apnea (adult) (pediatric): Secondary | ICD-10-CM | POA: Diagnosis not present

## 2021-10-28 NOTE — Patient Instructions (Signed)
Check your medications for carvedilol and metoprolol and call us to let us know what you are taking.   Medication Instructions:  Your physician recommends that you continue on your current medications as directed. Please refer to the Current Medication list given to you today.  *If you need a refill on your cardiac medications before your next appointment, please call your pharmacy*  Lab Work: Fasting Lipids and CMET   If you have labs (blood work) drawn today and your tests are completely normal, you will receive your results only by: [place] (if you have MyChart) OR A paper copy in the mail If you have any lab test that is abnormal or we need to change your treatment, we will call you to review the results.  Follow-Up: At [facility], you and your health needs are our priority.  As part of our continuing mission to provide you with exceptional heart care, we have created designated Provider Care Teams.  These Care Teams include your primary Cardiologist (physician) and Advanced Practice Providers (APPs -  Physician Assistants and Nurse Practitioners) who all work together to provide you with the care you need, when you need it.  Your next appointment:   1 year(s)  The format for your next appointment:   In Person  Provider:   Fransico Him, MD     Important Information About Sugar

## 2021-10-28 NOTE — Addendum Note (Signed)
Addended by: Theresia Majors on: 10/28/2021 04:03 PM   Modules accepted: Orders

## 2021-10-28 NOTE — Progress Notes (Signed)
Cardiology Office Note:    Date:  10/28/2021   ID:  Thomas Dudley, DOB June 05, 1960, MRN [id number]  PCP:  Juluis Rainier, MD (Inactive)  Cardiologist:  Armanda Magic, MD    Referring MD: No ref. provider found   Chief Complaint  Patient presents with   Sleep Apnea   Coronary Artery Disease   Hypertension   Hyperlipidemia    History of Present Illness:    Thomas Dudley is a 61 y.o. male with a hx of HTN, OSA, dyslipidemia and CAD in the setting of STEMI with PCI of obtuse marginal in 2004 with no nonobstructive disease in other territories.  Repeat cath 04/2008 with  PCI of OM2, PDA and distal RCA.  S/p STEMI with PCI of right PL and normal LVF 08/2012 in [place].  He also has a history of OSA, HTN, PVD, tobacco abuse and dyslipidemia.    He is here today for followup and is doing well.  He denies any chest pain or pressure, SOB, DOE, PND, orthopnea, LE edema, dizziness, palpitations or syncope. He is compliant with his meds and is tolerating meds with no SE.     He is doing well with his CPAP device and thinks that he has gotten used to it.  He tolerates the mask and feels the pressure is adequate.  Since going on CPAP he feels rested in the am and has no significant daytime sleepiness but does nap some during the day. He gets about 5 hours sleep nightly.   He denies any significant mouth or nasal dryness or nasal congestion.  He does not think that he snores.     Past Medical History:  Diagnosis Date   Allergic rhinitis    Arthritis    Back pain    Coronary atherosclerosis of native coronary artery    post PCI of obtuse marginal in 2004 with no nonobstructive disease in other territories, cath 04/2008 PCI of OM2, PDA and distal RCA, s/p STEMI with PCI of right PL and normal LVF 08/2012 in [place]   Depression    takes Wellbutrin daily   Diverticulitis    Gastritis    GERD (gastroesophageal reflux disease)    takes Protonix daily   Hemorrhage of rectum and  anus    History of colon polyps    HTN (hypertension)    takes Lisinopril and Metoprolol daily   Hypercholesteremia    takes Lipitora daily   Insomnia    takes Ambien nightly   Insomnia    Joint pain    Myocardial infarction (HCC) 09/10/12   OSA (obstructive sleep apnea)    sleep study done > 76yrs ago;uses CPAP   Pneumonia    hx of ;about 8yrs ago   PVD (peripheral vascular disease) (HCC)    Tobacco abuse     Past Surgical History:  Procedure Laterality Date   ADENOIDECTOMY     CARDIAC CATHETERIZATION     multiple   CARPAL TUNNEL RELEASE Right    COLONOSCOPY     CORONARY ANGIOPLASTY     total of 6 stents    KNEE ARTHROSCOPY Left 09/22/2013   Procedure: LEFT KNEE ARTHROSCOPY ;  Surgeon: Verlee Rossetti, MD;  Location[facility];  Service: Orthopedics;  Laterality: Left;   NASAL SEPTOPLASTY W/ TURBINOPLASTY     TONSILLECTOMY      Current Medications: Current Meds  Medication Sig   amitriptyline (ELAVIL) 25 MG tablet Take 75 mg by mouth at bedtime.  amLODipine (NORVASC) 10 MG tablet TAKE 1 TABLET(10 MG) BY MOUTH DAILY   aspirin 81 MG tablet Take 1 tablet (81 mg total) by mouth daily.   atorvastatin (LIPITOR) 80 MG tablet TAKE 1 TABLET(80 MG) BY MOUTH DAILY   carvedilol (COREG) 12.5 MG tablet Take 1 tablet (12.5 mg total) by mouth 2 (two) times daily with a meal. Patient needs to keep upcoming appointment for any future refills.   clopidogrel (PLAVIX) 75 MG tablet TAKE 1 TABLET(75 MG) BY MOUTH DAILY   Evolocumab (REPATHA SURECLICK) 140 MG/ML SOAJ INJECT 1 PEN UNDER THE SKIN EVERY 14 DAYS   lisinopril (ZESTRIL) 40 MG tablet TAKE 1 TABLET BY MOUTH DAILY   metoprolol succinate (TOPROL-XL) 100 MG 24 hr tablet TAKE 1 TABLET BY MOUTH DAILY   nitroGLYCERIN (NITROSTAT) 0.4 MG SL tablet DISSOLVE 1 TABLET UNDER THE TONGUE EVERY 5 MINUTES AS NEEDED FOR CHEST PAIN   pantoprazole (PROTONIX) 40 MG tablet Take one tablet daily     Allergies:   Neomycin-bacitracin zn-polymyx and Other    Social History   Socioeconomic History   Marital status: Married    Spouse name: Not on file   Number of children: Not on file   Years of education: Not on file   Highest education level: Not on file  Occupational History   Not on file  Tobacco Use   Smoking status: Every Day    Packs/day: 1.00    Years: 35.00    Pack years: 35.00    Types: Cigarettes   Smokeless tobacco: Never  Substance and Sexual Activity   Alcohol use: Yes    Alcohol/week: 0.0 standard drinks    Comment: 6pk every 6 months   Drug use: No   Sexual activity: Yes  Other Topics Concern   Not on file  Social History Narrative   Not on file   Social Determinants of Health   Financial Resource Strain: Not on file  Food Insecurity: Not on file  Transportation Needs: Not on file  Physical Activity: Not on file  Stress: Not on file  Social Connections: Not on file     Family History: The patient's family history includes Breast cancer in his mother; Heart disease in his mother; Lung cancer in his father.  ROS:   Please see the history of present illness.    ROS  All other systems reviewed and negative.   EKGs/Labs/Other Studies Reviewed:    The following studies were reviewed today: 2D echo 06/2018 IMPRESSIONS    1. Left ventricular ejection fraction, by visual estimation, is 55 to  60%. The left ventricle has normal function. There is mildly increased  left ventricular hypertrophy.   2. The left ventricle has no regional wall motion abnormalities.   3. Left ventricular ejection fraction by 3D volume is is 58 %.   4. Global right ventricle has normal systolic function.The right  ventricular size is normal. No increase in right ventricular wall  thickness.   5. Left atrial size was normal.   6. Right atrial size was normal.   7. The mitral valve is normal in structure. Mild mitral valve  regurgitation.   8. The tricuspid valve is normal in structure. Tricuspid valve  regurgitation is not  demonstrated.   9. The aortic valve is tricuspid. Aortic valve regurgitation is mild. No  evidence of aortic valve sclerosis or stenosis.  10. The pulmonic valve was not well visualized. Pulmonic valve  regurgitation is not visualized.  11. The tricuspid regurgitant  velocity is 2.20 m/s, and with an assumed  right atrial pressure of 3 mmHg, the estimated right ventricular systolic  pressure is normal at 22.4 mmHg.  12. The inferior vena cava is normal in size with greater than 50%  respiratory variability, suggesting right atrial pressure of 3 mmHg.   Tollie EthLeixscan myoview 05/2019 Study Highlights  Nuclear stress EF: 49%. The left ventricular ejection fraction is mildly decreased (45-54%). There was no ST segment deviation noted during stress. This is a low risk study. There is no evidence of previous infarction or evidence of ischemia. The study is normal.  EKG:  EKG is ordered today and demonstrates NSR with iRBBB  Recent Labs: No results found for requested labs within last 8760 hours.   Recent Lipid Panel    Component Value Date/Time   CHOL 83 (L) 08/28/2019 0730   TRIG 60 08/28/2019 0730   HDL 46 08/28/2019 0730   CHOLHDL 1.8 08/28/2019 0730   CHOLHDL 3.1 06/12/2015 0821   VLDL 23 06/12/2015 0821   LDLCALC 23 08/28/2019 0730    Physical Exam:    VS:  BP 126/70   Pulse (!) 58   Ht 5\' 11"  (1.803 m)   Wt 192 lb 3.2 oz (87.2 kg)   SpO2 98%   BMI 26.81 kg/m     Wt Readings from Last 3 Encounters:  10/28/21 192 lb 3.2 oz (87.2 kg)  08/07/20 195 lb 6.4 oz (88.6 kg)  05/11/19 195 lb 12 oz (88.8 kg)    GEN: Well nourished, well developed in no acute distress HEENT: Normal NECK: No JVD; No carotid bruits LYMPHATICS: No lymphadenopathy CARDIAC:RRR, no murmurs, rubs, gallops RESPIRATORY:  Clear to auscultation without rales, wheezing or rhonchi  ABDOMEN: Soft, non-tender, non-distended MUSCULOSKELETAL:  No edema; No deformity  SKIN: Warm and dry NEUROLOGIC:  Alert and  oriented x 3 PSYCHIATRIC:  Normal affect   ASSESSMENT:    1. Primary hypertension   2. Coronary artery disease involving native coronary artery of native heart without angina pectoris   3. OSA (obstructive sleep apnea)   4. Dyslipidemia, goal LDL below 70    PLAN:    In order of problems listed above:  1.  ASCAD -s/p remote STEMI with PCI of obtuse marginal in 2004 with nonobstructive disease in other territories.  Repeat cath 04/2008 with  PCI of OM2, PDA and distal RCA.  S/p STEMI with PCI of right PL and [place] [place] [facility] [place] -He denies any anginal symptoms -Continue aspirin 81 mg daily, Plavix 75 mg daily, atorvastatin 80 mg daily, carvedilol 12.5 mg twice daily with as needed refills  2.  HTN -BP is adequately controlled on exam -Continue prescription drug management with amlodipine 10 mg daily, carvedilol 12.5 mg twice daily and Zestril 40 mg daily with as needed refills -Check bmet  3.  OSA - The patient is tolerating PAP therapy well without any problems. The PAP download was reviewed today and showed an AHI of 3.2/h on 17 cm H2O with 97% compliance in using more than 4 hours nightly.  The patient has been using and benefiting from PAP use and will continue to benefit from therapy.   4.  HLD -LDL goal < 70 -Check FLP and ALT -Continue prescription drug management with Repatha and atorvastatin 80 mg daily with as needed refills  Medication Adjustments/Labs and Tests Ordered: Current medicines are reviewed at length with the patient today.  Concerns regarding medicines are outlined above.  Orders Placed  This Encounter  Procedures   EKG 12-Lead   No orders of the defined types were placed in this encounter.   Signed, Armanda Magic, MD  10/28/2021 3:56 PM    [facility]

## 2021-11-03 ENCOUNTER — Telehealth: Payer: Self-pay | Admitting: Cardiology

## 2021-11-03 NOTE — Telephone Encounter (Signed)
Pt aware of recommendations and has B/P cuff at home will monitor and will call next week with readings ./cy

## 2021-11-03 NOTE — Telephone Encounter (Signed)
Per pt has been taking both the Carvedilol 12.5 mg bid and Metoprolol succ 100 mg qd Will forward to Dr Radford Pax for review and recommendations

## 2021-11-03 NOTE — Telephone Encounter (Signed)
Pt c/o medication issue:  1. Name of Medication:  carvedilol (COREG) 12.5 MG tablet metoprolol succinate (TOPROL-XL) 100 MG 24 hr tablet  2. How are you currently taking this medication (dosage and times per day)?  Take 1 tablet (12.5 mg total) by mouth 2 (two) times daily with a meal. TAKE 1 TABLET BY MOUTH DAILY  3. Are you having a reaction (difficulty breathing--STAT)?   4. What is your medication issue? Patient states he is only supposed to be taking one of these medications.  He needs Dr. Lovena Le to advise him which one she wants him to take and which one to stop taking.

## 2021-11-05 ENCOUNTER — Other Ambulatory Visit: Payer: 59

## 2021-11-07 ENCOUNTER — Other Ambulatory Visit: Payer: 59

## 2021-11-07 DIAGNOSIS — I2510 Atherosclerotic heart disease of native coronary artery without angina pectoris: Secondary | ICD-10-CM

## 2021-11-07 DIAGNOSIS — I10 Essential (primary) hypertension: Secondary | ICD-10-CM

## 2021-11-07 DIAGNOSIS — E785 Hyperlipidemia, unspecified: Secondary | ICD-10-CM

## 2021-11-07 DIAGNOSIS — G4733 Obstructive sleep apnea (adult) (pediatric): Secondary | ICD-10-CM

## 2021-11-07 LAB — COMPREHENSIVE METABOLIC PANEL
ALT: 10 IU/L (ref 0–44)
AST: 16 IU/L (ref 0–40)
Albumin/Globulin Ratio: 2 (ref 1.2–2.2)
Albumin: 4.2 g/dL (ref 3.8–4.9)
Alkaline Phosphatase: 91 IU/L (ref 44–121)
BUN/Creatinine Ratio: 16 (ref 10–24)
BUN: 13 mg/dL (ref 8–27)
Bilirubin Total: 0.2 mg/dL (ref 0.0–1.2)
CO2: 24 mmol/L (ref 20–29)
Calcium: 8.3 mg/dL — ABNORMAL LOW (ref 8.6–10.2)
Chloride: 104 mmol/L (ref 96–106)
Creatinine, Ser: 0.79 mg/dL (ref 0.76–1.27)
Globulin, Total: 2.1 g/dL (ref 1.5–4.5)
Glucose: 161 mg/dL — ABNORMAL HIGH (ref 70–99)
Potassium: 4.1 mmol/L (ref 3.5–5.2)
Sodium: 140 mmol/L (ref 134–144)
Total Protein: 6.3 g/dL (ref 6.0–8.5)
eGFR: 102 mL/min/{1.73_m2} (ref >59)

## 2021-11-07 LAB — LIPID PANEL
Chol/HDL Ratio: 1.7 ratio (ref 0.0–5.0)
Cholesterol, Total: 90 mg/dL — ABNORMAL LOW (ref 100–199)
HDL: 52 mg/dL (ref >39)
LDL Chol Calc (NIH): 22 mg/dL (ref 0–99)
Triglycerides: 78 mg/dL (ref 0–149)
VLDL Cholesterol Cal: 16 mg/dL (ref 5–40)

## 2021-12-04 ENCOUNTER — Other Ambulatory Visit: Payer: Self-pay | Admitting: Cardiology

## 2022-01-05 ENCOUNTER — Other Ambulatory Visit: Payer: Self-pay | Admitting: Cardiology

## 2022-05-11 ENCOUNTER — Telehealth: Payer: Self-pay | Admitting: Cardiology

## 2022-05-11 DIAGNOSIS — G4733 Obstructive sleep apnea (adult) (pediatric): Secondary | ICD-10-CM

## 2022-05-11 NOTE — Telephone Encounter (Signed)
Pt calling to f/u on sleep supplies. Please advise

## 2022-05-13 NOTE — Telephone Encounter (Signed)
Reached out to patient call completed.

## 2022-05-14 ENCOUNTER — Telehealth: Payer: Self-pay | Admitting: *Deleted

## 2022-05-14 DIAGNOSIS — I2510 Atherosclerotic heart disease of native coronary artery without angina pectoris: Secondary | ICD-10-CM

## 2022-05-14 DIAGNOSIS — G4733 Obstructive sleep apnea (adult) (pediatric): Secondary | ICD-10-CM

## 2022-05-14 DIAGNOSIS — I10 Essential (primary) hypertension: Secondary | ICD-10-CM

## 2022-05-14 NOTE — Telephone Encounter (Signed)
ORDER UPDATED Order ResMed CPAP on auto from 4 to 15cm H2O with heated humidity, mask of choice  Followup in 6 weeks with me.

## 2022-06-10 ENCOUNTER — Other Ambulatory Visit: Payer: Self-pay | Admitting: Cardiology

## 2022-06-23 ENCOUNTER — Other Ambulatory Visit (HOSPITAL_COMMUNITY): Payer: Self-pay

## 2022-07-01 ENCOUNTER — Other Ambulatory Visit (HOSPITAL_COMMUNITY): Payer: Self-pay

## 2022-07-20 ENCOUNTER — Ambulatory Visit: Payer: 59 | Admitting: Neurology

## 2022-09-10 ENCOUNTER — Telehealth: Payer: Self-pay | Admitting: Cardiology

## 2022-09-10 NOTE — Telephone Encounter (Signed)
What problem are you experiencing? Machine is not working right anymore  Who is Nurse, learning disability company? Doesn't know   Wife is concerned due to patient having breathing issues at night again and machine is starting to make noises it hasn't prior. Pt states he never gets a callback from sleep company. Please advise. Please route to the sleep study assistant.

## 2022-09-18 NOTE — Telephone Encounter (Signed)
Left message on voicemail and informed patient to call back. Latrelle Dodrill, CMA.

## 2022-10-27 ENCOUNTER — Other Ambulatory Visit: Payer: Self-pay | Admitting: Cardiology

## 2022-11-19 ENCOUNTER — Telehealth: Payer: Self-pay | Admitting: Cardiology

## 2022-11-19 DIAGNOSIS — I10 Essential (primary) hypertension: Secondary | ICD-10-CM

## 2022-11-19 DIAGNOSIS — I6523 Occlusion and stenosis of bilateral carotid arteries: Secondary | ICD-10-CM

## 2022-11-19 DIAGNOSIS — E785 Hyperlipidemia, unspecified: Secondary | ICD-10-CM

## 2022-11-19 MED ORDER — REPATHA SURECLICK 140 MG/ML ~~LOC~~ SOAJ
140.0000 mg | SUBCUTANEOUS | 0 refills | Status: DC
Start: 2022-11-19 — End: 2022-12-09

## 2022-11-19 NOTE — Telephone Encounter (Signed)
 *  STAT* If patient is at the pharmacy, call can be transferred to refill team.   1. Which medications need to be refilled? (please list name of each medication and dose if known)   REPATHA SURECLICK 140 MG/ML SOAJ    2. Which pharmacy/location (including street and city if local pharmacy) is medication to be sent to? [facility]  phone # [phone]  3. Do they need a 30 day or 90 day supply? 30 days   Pt is out of meds. Per his insurance, prescription need to send to new pharmacy  [facility]  phone # [phone]

## 2022-12-07 ENCOUNTER — Other Ambulatory Visit (HOSPITAL_COMMUNITY): Payer: Self-pay

## 2022-12-07 ENCOUNTER — Telehealth: Payer: Self-pay

## 2022-12-07 ENCOUNTER — Ambulatory Visit: Payer: 59 | Attending: Cardiology | Admitting: Cardiology

## 2022-12-07 ENCOUNTER — Encounter: Payer: Self-pay | Admitting: Cardiology

## 2022-12-07 VITALS — BP 116/64 | HR 75 | Ht 71.0 in | Wt 202.8 lb

## 2022-12-07 DIAGNOSIS — I10 Essential (primary) hypertension: Secondary | ICD-10-CM

## 2022-12-07 DIAGNOSIS — G4733 Obstructive sleep apnea (adult) (pediatric): Secondary | ICD-10-CM

## 2022-12-07 DIAGNOSIS — E785 Hyperlipidemia, unspecified: Secondary | ICD-10-CM | POA: Diagnosis not present

## 2022-12-07 DIAGNOSIS — I6523 Occlusion and stenosis of bilateral carotid arteries: Secondary | ICD-10-CM

## 2022-12-07 DIAGNOSIS — I2510 Atherosclerotic heart disease of native coronary artery without angina pectoris: Secondary | ICD-10-CM

## 2022-12-07 NOTE — Patient Instructions (Signed)
Medication Instructions:  Your physician recommends that you continue on your current medications as directed. Please refer to the Current Medication list given to you today.  *If you need a refill on your cardiac medications before your next appointment, please call your pharmacy*  Follow-Up: At [facility], you and your health needs are our priority.  As part of our continuing mission to provide you with exceptional heart care, we have created designated Provider Care Teams.  These Care Teams include your primary Cardiologist (physician) and Advanced Practice Providers (APPs -  Physician Assistants and Nurse Practitioners) who all work together to provide you with the care you need, when you need it.   Your next appointment:   1 year(s)  Provider:   Armanda Magic, MD     Other Instructions Call us once you have received your Repatha

## 2022-12-07 NOTE — Telephone Encounter (Signed)
-----   Message from Carolan Clines, CPhT sent at 12/07/2022  4:34 PM EDT ----- According to test claim his new insurance [facility] requires him to get this filled at their  specialty pharmacy (''[facility]) at [phone]. The test claim also doesn't state a p/a is needed  ----- Message ----- From: Olene Floss, RPH-CPP Sent: 12/07/2022  12:53 PM EDT To: Rx Prior Auth Team  Can we submit PA to his new insurance? Looks like card is in media ----- Message ----- From: Frutoso Schatz, RN Sent: 12/07/2022  12:26 PM EDT To: [facility]  Patient is having trouble getting his Repatha. He has not had it in over 7 weeks. Could you call him to assist?  Thanks! Carly

## 2022-12-07 NOTE — Telephone Encounter (Signed)
Repatha test claim for pts new ''[facility]''

## 2022-12-07 NOTE — Progress Notes (Signed)
Cardiology Office Note:    Date:  12/07/2022   ID:  Thomas Dudley, DOB March 30, 1961, MRN [id number]  PCP:  Juluis Rainier, MD (Inactive)  Cardiologist:  Armanda Magic, MD    Referring MD: No ref. provider found   Chief Complaint  Patient presents with   Coronary Artery Disease   Hypertension   Hyperlipidemia   Sleep Apnea    History of Present Illness:    Thomas Dudley is a 62 y.o. male with a hx of HTN, OSA, dyslipidemia and CAD in the setting of STEMI with PCI of obtuse marginal in 2004 with no nonobstructive disease in other territories.  Repeat cath 04/2008 with  PCI of OM2, PDA and distal RCA.  S/p STEMI with PCI of right PL and normal LVF 08/2012 in [place].  He also has a history of OSA, HTN, PVD, tobacco abuse and dyslipidemia.    He is here today for followup and is doing well.  He denies any chest pain or pressure, SOB, DOE, PND, orthopnea, LE edema, dizziness, palpitations or syncope. He is compliant with his meds and is tolerating meds with no SE.    He is doing well with his PAP device and thinks that he has gotten used to it.  He tolerates the mask and feels the pressure is adequate.  Since going on PAP he feels rested in the am and has no significant daytime sleepiness.  He denies any significant mouth or nasal dryness or nasal congestion.  He does not think that he snores.    Past Medical History:  Diagnosis Date   Allergic rhinitis    Arthritis    Back pain    Coronary atherosclerosis of native coronary artery    post PCI of obtuse marginal in 2004 with no nonobstructive disease in other territories, cath 04/2008 PCI of OM2, PDA and distal RCA, s/p STEMI with PCI of right PL and normal LVF 08/2012 in [place]   Depression    takes Wellbutrin daily   Diverticulitis    Gastritis    GERD (gastroesophageal reflux disease)    takes Protonix daily   Hemorrhage of rectum and anus    History of colon polyps    HTN (hypertension)    takes Lisinopril  and Metoprolol daily   Hypercholesteremia    takes Lipitora daily   Insomnia    takes Ambien nightly   Insomnia    Joint pain    Myocardial infarction (HCC) 09/10/12   OSA (obstructive sleep apnea)    sleep study done > 42yrs ago;uses CPAP   Pneumonia    hx of ;about 35yrs ago   PVD (peripheral vascular disease) (HCC)    Tobacco abuse     Past Surgical History:  Procedure Laterality Date   ADENOIDECTOMY     CARDIAC CATHETERIZATION     multiple   CARPAL TUNNEL RELEASE Right    COLONOSCOPY     CORONARY ANGIOPLASTY     total of 6 stents    KNEE ARTHROSCOPY Left 09/22/2013   Procedure: LEFT KNEE ARTHROSCOPY ;  Surgeon: Verlee Rossetti, MD;  Location: [facility] OR;  Service: Orthopedics;  Laterality: Left;   NASAL SEPTOPLASTY W/ TURBINOPLASTY     TONSILLECTOMY      Current Medications: Current Meds  Medication Sig   amitriptyline (ELAVIL) 25 MG tablet Take 75 mg by mouth at bedtime.   amLODipine (NORVASC) 10 MG tablet TAKE 1 TABLET(10 MG) BY MOUTH DAILY   aspirin  81 MG tablet Take 1 tablet (81 mg total) by mouth daily.   atorvastatin (LIPITOR) 80 MG tablet TAKE 1 TABLET(80 MG) BY MOUTH DAILY   carvedilol (COREG) 12.5 MG tablet TAKE 1 TABLET(12.5 MG) BY MOUTH TWICE DAILY WITH A MEAL   clopidogrel (PLAVIX) 75 MG tablet TAKE 1 TABLET(75 MG) BY MOUTH DAILY   Evolocumab (REPATHA SURECLICK) 140 MG/ML SOAJ Inject 140 mg into the skin every 14 (fourteen) days.   lisinopril (ZESTRIL) 40 MG tablet TAKE 1 TABLET BY MOUTH DAILY   metoprolol succinate (TOPROL-XL) 100 MG 24 hr tablet TAKE 1 TABLET BY MOUTH DAILY   nitroGLYCERIN (NITROSTAT) 0.4 MG SL tablet DISSOLVE 1 TABLET UNDER THE TONGUE EVERY 5 MINUTES AS NEEDED FOR CHEST PAIN   pantoprazole (PROTONIX) 40 MG tablet Take one tablet daily     Allergies:   Neomycin-bacitracin zn-polymyx and Other   Social History   Socioeconomic History   Marital status: Married    Spouse name: Not on file   Number of children: Not on file   Years of  education: Not on file   Highest education level: Not on file  Occupational History   Not on file  Tobacco Use   Smoking status: Every Day    Packs/day: 1.00    Years: 35.00    Additional pack years: 0.00    Total pack years: 35.00    Types: Cigarettes   Smokeless tobacco: Never  Substance and Sexual Activity   Alcohol use: Yes    Alcohol/week: 0.0 standard drinks of alcohol    Comment: 6pk every 6 months   Drug use: No   Sexual activity: Yes  Other Topics Concern   Not on file  Social History Narrative   Not on file   Social Determinants of Health   Financial Resource Strain: Not on file  Food Insecurity: Not on file  Transportation Needs: Not on file  Physical Activity: Not on file  Stress: Not on file  Social Connections: Not on file     Family History: The patient's family history includes Breast cancer in his mother; Heart disease in his mother; Lung cancer in his father.  ROS:   Please see the history of present illness.    ROS  All other systems reviewed and negative.   EKGs/Labs/Other Studies Reviewed:    The following studies were reviewed today: 2D echo 06/2018 IMPRESSIONS    1. Left ventricular ejection fraction, by visual estimation, is 55 to  60%. The left ventricle has normal function. There is mildly increased  left ventricular hypertrophy.   2. The left ventricle has no regional wall motion abnormalities.   3. Left ventricular ejection fraction by 3D volume is is 58 %.   4. Global right ventricle has normal systolic function.The right  ventricular size is normal. No increase in right ventricular wall  thickness.   5. Left atrial size was normal.   6. Right atrial size was normal.   7. The mitral valve is normal in structure. Mild mitral valve  regurgitation.   8. The tricuspid valve is normal in structure. Tricuspid valve  regurgitation is not demonstrated.   9. The aortic valve is tricuspid. Aortic valve regurgitation is mild. No  evidence  of aortic valve sclerosis or stenosis.  10. The pulmonic valve was not well visualized. Pulmonic valve  regurgitation is not visualized.  11. The tricuspid regurgitant velocity is 2.20 m/s, and with an assumed  right atrial pressure of 3 mmHg, the estimated right  ventricular systolic  pressure is normal at 22.4 mmHg.  12. The inferior vena cava is normal in size with greater than 50%  respiratory variability, suggesting right atrial pressure of 3 mmHg.   Tollie Eth 05/2019 Study Highlights  Nuclear stress EF: 49%. The left ventricular ejection fraction is mildly decreased (45-54%). There was no ST segment deviation noted during stress. This is a low risk study. There is no evidence of previous infarction or evidence of ischemia. The study is normal.  EKG:  EKG is ordered today and demonstrates NSR with iRBBB  Recent Labs: No results found for requested labs within last 365 days.   Recent Lipid Panel    Component Value Date/Time   CHOL 90 (L) 11/07/2021 0714   TRIG 78 11/07/2021 0714   HDL 52 11/07/2021 0714   CHOLHDL 1.7 11/07/2021 0714   CHOLHDL 3.1 06/12/2015 0821   VLDL 23 06/12/2015 0821   LDLCALC 22 11/07/2021 0714    Physical Exam:    VS:  BP 116/64   Pulse 75   Ht 5\' 11"  (1.803 m)   Wt 202 lb 12.8 oz (92 kg)   SpO2 97%   BMI 28.28 kg/m     Wt Readings from Last 3 Encounters:  12/07/22 202 lb 12.8 oz (92 kg)  10/28/21 192 lb 3.2 oz (87.2 kg)  08/07/20 195 lb 6.4 oz (88.6 kg)    GEN: Well nourished, well developed in no acute distress HEENT: Normal NECK: No JVD; No carotid bruits LYMPHATICS: No lymphadenopathy CARDIAC:RRR, no murmurs, rubs, gallops RESPIRATORY:  Clear to auscultation without rales, wheezing or rhonchi  ABDOMEN: Soft, non-tender, non-distended MUSCULOSKELETAL:  No edema; No deformity  SKIN: Warm and dry NEUROLOGIC:  Alert and oriented x 3 PSYCHIATRIC:  Normal affect   ASSESSMENT:    1. Coronary artery disease involving native  coronary artery of native heart without angina pectoris   2. Primary hypertension   3. OSA (obstructive sleep apnea)   4. Dyslipidemia, goal LDL below 70    PLAN:    In order of problems listed above:  1.  ASCAD -s/p remote STEMI with PCI of obtuse marginal in 2004 with nonobstructive disease in other territories.  Repeat cath 04/2008 with  PCI of OM2, PDA and distal RCA.  S/p STEMI with PCI of right PL and normal LVF 08/2012 in [facility] [place] -He denies any anginal sx since I saw him last -Continue prescription drug management with aspirin 81 mg daily, atorvastatin 80 mg daily, carvedilol 12.5 mg twice daily, Plavix 75 mg daily, Repatha and Toprol-XL 100 mg daily with as needed refills  2.  HTN -BP is adequately controlled on exam today -Continue prescription drug management with amlodipine 10 mg daily, carvedilol 12.5 mg twice daily, lisinopril 40 mg daily and Toprol-XL 100 mg daily with as needed refills -check BMET today  3.  OSA - The patient is tolerating PAP therapy well without any problems. The PAP download performed by his DME was personally reviewed and interpreted by me today and showed an AHI of 0.6 /hr on auto CPAP from 4-15 cm H2O with 100 % compliance in using more than 4 hours nightly.  The patient has been using and benefiting from PAP use and will continue to benefit from therapy.   4.  HLD -LDL goal < 70 -Check fasting lipid panel once he has been back on Repatha which he has  not been on in 6 weeks as he is supposed to get it from  a specialty pharmacy and there is some mix up that I will discuss with my Pharm D -continue prescription drug management with atorvastatin 80 mg daily and Repatha with as needed refills (see above)  Medication Adjustments/Labs and Tests Ordered: Current medicines are reviewed at length with the patient today.  Concerns regarding medicines are outlined above.  No orders of the defined types were placed in this encounter.  No orders of the  defined types were placed in this encounter.   Signed, Armanda Magic, MD  12/07/2022 11:49 AM    [facility]

## 2022-12-08 NOTE — Telephone Encounter (Signed)
PA submitted Key: [id number]

## 2022-12-09 MED ORDER — REPATHA SURECLICK 140 MG/ML ~~LOC~~ SOAJ
140.0000 mg | SUBCUTANEOUS | 3 refills | Status: DC
Start: 2022-12-09 — End: 2024-01-16

## 2022-12-09 NOTE — Telephone Encounter (Signed)
PA approved. Rx sent to specialty pharmacy and patient made aware.

## 2023-01-18 ENCOUNTER — Ambulatory Visit: Payer: 59 | Admitting: Cardiology

## 2023-02-04 ENCOUNTER — Other Ambulatory Visit: Payer: Self-pay | Admitting: Cardiology

## 2023-07-12 ENCOUNTER — Other Ambulatory Visit: Payer: Self-pay | Admitting: Cardiology

## 2023-09-21 ENCOUNTER — Telehealth: Payer: Self-pay | Admitting: Cardiology

## 2023-09-21 MED ORDER — LISINOPRIL 40 MG PO TABS: ORAL_TABLET | ORAL | 0 refills | Status: DC

## 2023-09-21 NOTE — Telephone Encounter (Signed)
*  STAT* If patient is at the pharmacy, call can be transferred to refill team.   1. Which medications need to be refilled? (please list name of each medication and dose if known)   lisinopril  (ZESTRIL ) 40 MG tablet   2. Would you like to learn more about the convenience, safety, & potential cost savings by using the [facility]?   3. Are you open to using the [facility] (Type [facility]. ).  4. Which pharmacy/location (including street and city if local pharmacy) is medication to be sent to?  [id number] - [place], [place] - [address]   5. Do they need a 30 day or 90 day supply?  90 day  Patient stated he is completely out of this medication and will be going out of town tomorrow.

## 2023-09-21 NOTE — Telephone Encounter (Signed)
 RX sent to requested Pharmacy

## 2023-11-09 ENCOUNTER — Telehealth: Payer: Self-pay | Admitting: Pharmacy Technician

## 2023-11-09 ENCOUNTER — Other Ambulatory Visit (HOSPITAL_COMMUNITY): Payer: Self-pay

## 2023-11-09 DIAGNOSIS — E785 Hyperlipidemia, unspecified: Secondary | ICD-10-CM

## 2023-11-09 NOTE — Telephone Encounter (Signed)
  Received this through [facility] from renewals. Ran a test claim and said he needs to get through [facility] at their specialty pharmacy at [phone] no note about prior auth needed on test claim.    No other labs found in care everywhere, [facility], media  Lmom for patient to make him aware

## 2023-11-10 NOTE — Telephone Encounter (Signed)
 Left message for pt to call, he is also due for follow up appointment in july

## 2023-11-10 NOTE — Telephone Encounter (Signed)
 Received notification from [employer] that this does need a prior authorization done. insurance states labs have to be done within the last 6 months. Last labs found were from 2023. Can patient get new labs for this renewal prior authorization please? Thank you!

## 2023-11-12 NOTE — Telephone Encounter (Signed)
 Left second message for patient stating that we did get notice that insurance wants prior authorization and are requiring updated labs. Asked that he call back so we can get set up for him.

## 2023-11-15 NOTE — Addendum Note (Signed)
 Addended by: Guss Legacy on: 11/15/2023 02:15 PM   Modules accepted: Orders

## 2023-11-15 NOTE — Telephone Encounter (Signed)
 3rd call attempt- patient will have labs ordered this week. Labs ordered.

## 2023-12-18 ENCOUNTER — Other Ambulatory Visit: Payer: Self-pay | Admitting: Cardiology

## 2023-12-28 ENCOUNTER — Other Ambulatory Visit: Payer: Self-pay | Admitting: Cardiology

## 2023-12-29 ENCOUNTER — Ambulatory Visit: Payer: Self-pay | Admitting: Cardiology

## 2023-12-29 ENCOUNTER — Telehealth: Payer: Self-pay

## 2023-12-29 LAB — LIPID PANEL
Chol/HDL Ratio: 2.2 ratio (ref 0.0–5.0)
Cholesterol, Total: 104 mg/dL (ref 100–199)
HDL: 47 mg/dL (ref >39)
LDL Chol Calc (NIH): 41 mg/dL (ref 0–99)
Triglycerides: 77 mg/dL (ref 0–149)
VLDL Cholesterol Cal: 16 mg/dL (ref 5–40)

## 2023-12-29 LAB — HEPATIC FUNCTION PANEL
ALT: 15 IU/L (ref 0–44)
AST: 14 IU/L (ref 0–40)
Albumin: 4.1 g/dL (ref 3.9–4.9)
Alkaline Phosphatase: 94 IU/L (ref 44–121)
Bilirubin Total: 0.3 mg/dL (ref 0.0–1.2)
Bilirubin, Direct: 0.12 mg/dL (ref 0.00–0.40)
Total Protein: 6.1 g/dL (ref 6.0–8.5)

## 2023-12-29 NOTE — Telephone Encounter (Signed)
 Patient states his [facility] pharmacy [facility] is not filling his repatha , he is not sure if he needs a prior auth. He states he has never paid anything for his medication so he does not know if he received a grant or any other patient assistance.

## 2023-12-29 NOTE — Telephone Encounter (Signed)
 Call to patient to advise Lipids at goal. Patient agrees to continue current therapy,  forwarded to PCP.

## 2023-12-29 NOTE — Telephone Encounter (Signed)
-----   Message from Wilbert Bihari sent at 12/29/2023  8:12 AM EDT ----- Lipids at goal continue current therapy and forward to PCP ----- Message ----- From: Interface, [facility] Lab Results In Sent: 12/29/2023  12:35 AM EDT To: Wilbert JONELLE Bihari, MD

## 2023-12-30 ENCOUNTER — Other Ambulatory Visit (HOSPITAL_COMMUNITY): Payer: Self-pay

## 2023-12-30 ENCOUNTER — Telehealth: Payer: Self-pay | Admitting: Pharmacy Technician

## 2023-12-30 NOTE — Telephone Encounter (Signed)
   Pharmacy Patient Advocate Encounter  Received notification from [employer] that Prior Authorization for repatha  has been APPROVED from 12/30/23 to 06/27/24   PA #/Case ID/Reference #: [id number]

## 2023-12-30 NOTE — Telephone Encounter (Signed)
 FROM PT CALLS Pharmacy Patient Advocate Encounter   Received notification from Pt Calls Messages that prior authorization for REPATHA  is required/requested.   Insurance verification completed.   The patient is insured through [employer] .   Per test claim: PA required; PA submitted to above mentioned insurance via [employer] Key/confirmation #/EOC [id number] Status is pending

## 2023-12-30 NOTE — Telephone Encounter (Signed)
 Lmom for pt to let him know pa approved

## 2024-01-15 ENCOUNTER — Other Ambulatory Visit: Payer: Self-pay | Admitting: Cardiology

## 2024-01-16 ENCOUNTER — Other Ambulatory Visit: Payer: Self-pay | Admitting: Pharmacist

## 2024-01-16 DIAGNOSIS — E785 Hyperlipidemia, unspecified: Secondary | ICD-10-CM

## 2024-01-16 DIAGNOSIS — I6523 Occlusion and stenosis of bilateral carotid arteries: Secondary | ICD-10-CM

## 2024-01-16 MED ORDER — REPATHA SURECLICK 140 MG/ML ~~LOC~~ SOAJ
140.0000 mg | SUBCUTANEOUS | 3 refills | Status: DC
Start: 2024-01-16 — End: 2024-04-13

## 2024-01-27 ENCOUNTER — Other Ambulatory Visit: Payer: Self-pay | Admitting: Cardiology

## 2024-02-09 ENCOUNTER — Other Ambulatory Visit: Payer: Self-pay | Admitting: Cardiology

## 2024-02-10 ENCOUNTER — Other Ambulatory Visit: Payer: Self-pay | Admitting: Cardiology

## 2024-02-26 ENCOUNTER — Other Ambulatory Visit: Payer: Self-pay | Admitting: Cardiology

## 2024-03-10 ENCOUNTER — Telehealth: Payer: Self-pay | Admitting: Cardiology

## 2024-03-10 ENCOUNTER — Other Ambulatory Visit: Payer: Self-pay | Admitting: Cardiology

## 2024-03-10 MED ORDER — AMLODIPINE BESYLATE 10 MG PO TABS: 10.0000 mg | ORAL_TABLET | Freq: Every day | ORAL | 0 refills | Status: DC

## 2024-03-10 NOTE — Telephone Encounter (Signed)
 Spoke with pt and asked for clarification on if the pt is currently not taking Amlodipine . The pt stated that he is taking it but has ran out. Pt states that there have been times where he has forgot to take it but that he has been taking it. Pt then stated that at least he thinks he had been taking it. Asked pt if he takes his BP at home and he said no and asked if he should. Advised pt that it would be a good idea to keep a BP log at home. Explained that because he was due for a 1 yr f/u in July 2025 and has not been seen yet (recall in but appt never made), we can send in a refill for the medication for enough tablets until his appt. Pt verbalized understanding of this and also asked for a current medication list to be mailed to him. Pt also asked for a MyChart link to be sent, code sent to pt phone while on the line. Pt scheduled for 04/13/24 with Dr. Shlomo.

## 2024-03-10 NOTE — Telephone Encounter (Signed)
 Pt c/o medication issue:  1. Name of Medication: amLODipine  (NORVASC ) 10 MG tablet   2. How are you currently taking this medication (dosage and times per day)? N/A  3. Are you having a reaction (difficulty breathing--STAT)? No  4. What is your medication issue? Pts wife would like to know if the pt should take this medication because he hasn't been doing so and would like a medication list.

## 2024-03-16 ENCOUNTER — Other Ambulatory Visit: Payer: Self-pay | Admitting: Cardiology

## 2024-03-17 ENCOUNTER — Other Ambulatory Visit: Payer: Self-pay | Admitting: Cardiology

## 2024-04-13 ENCOUNTER — Ambulatory Visit: Attending: Cardiology | Admitting: Cardiology

## 2024-04-13 ENCOUNTER — Encounter: Payer: Self-pay | Admitting: Cardiology

## 2024-04-13 ENCOUNTER — Telehealth: Payer: Self-pay

## 2024-04-13 ENCOUNTER — Other Ambulatory Visit: Payer: Self-pay | Admitting: Cardiology

## 2024-04-13 VITALS — BP 126/62 | HR 75 | Ht 71.0 in | Wt 187.8 lb

## 2024-04-13 DIAGNOSIS — Z79899 Other long term (current) drug therapy: Secondary | ICD-10-CM

## 2024-04-13 DIAGNOSIS — I2510 Atherosclerotic heart disease of native coronary artery without angina pectoris: Secondary | ICD-10-CM | POA: Diagnosis not present

## 2024-04-13 DIAGNOSIS — I6523 Occlusion and stenosis of bilateral carotid arteries: Secondary | ICD-10-CM

## 2024-04-13 DIAGNOSIS — G4733 Obstructive sleep apnea (adult) (pediatric): Secondary | ICD-10-CM | POA: Diagnosis not present

## 2024-04-13 DIAGNOSIS — E785 Hyperlipidemia, unspecified: Secondary | ICD-10-CM

## 2024-04-13 DIAGNOSIS — I10 Essential (primary) hypertension: Secondary | ICD-10-CM | POA: Diagnosis not present

## 2024-04-13 MED ORDER — NITROGLYCERIN 0.4 MG SL SUBL: 0.4000 mg | SUBLINGUAL_TABLET | SUBLINGUAL | 3 refills | Status: AC | PRN

## 2024-04-13 MED ORDER — LISINOPRIL 40 MG PO TABS: 40.0000 mg | ORAL_TABLET | Freq: Every day | ORAL | 3 refills | Status: AC

## 2024-04-13 MED ORDER — AMLODIPINE BESYLATE 10 MG PO TABS: 10.0000 mg | ORAL_TABLET | Freq: Every day | ORAL | 0 refills | Status: DC

## 2024-04-13 MED ORDER — NITROGLYCERIN 0.4 MG SL SUBL: 0.4000 mg | SUBLINGUAL_TABLET | SUBLINGUAL | 0 refills | Status: DC | PRN

## 2024-04-13 MED ORDER — CLOPIDOGREL BISULFATE 75 MG PO TABS: 75.0000 mg | ORAL_TABLET | Freq: Every day | ORAL | 3 refills | Status: AC

## 2024-04-13 MED ORDER — AMLODIPINE BESYLATE 10 MG PO TABS: 10.0000 mg | ORAL_TABLET | Freq: Every day | ORAL | 3 refills | Status: AC

## 2024-04-13 MED ORDER — ATORVASTATIN CALCIUM 80 MG PO TABS: 80.0000 mg | ORAL_TABLET | Freq: Every day | ORAL | 3 refills | Status: AC

## 2024-04-13 MED ORDER — REPATHA SURECLICK 140 MG/ML ~~LOC~~ SOAJ: 140.0000 mg | SUBCUTANEOUS | 3 refills | Status: DC

## 2024-04-13 MED ORDER — ATORVASTATIN CALCIUM 80 MG PO TABS: 80.0000 mg | ORAL_TABLET | Freq: Every day | ORAL | 0 refills | Status: DC

## 2024-04-13 MED ORDER — CARVEDILOL 12.5 MG PO TABS: 12.5000 mg | ORAL_TABLET | Freq: Two times a day (BID) | ORAL | 3 refills | Status: AC

## 2024-04-13 NOTE — Addendum Note (Signed)
 Addended by: JANIT GENI CROME on: 04/13/2024 03:11 PM   Modules accepted: Orders

## 2024-04-13 NOTE — Telephone Encounter (Signed)
 SABRA

## 2024-04-13 NOTE — Progress Notes (Signed)
 Cardiology Office Note:    Date:  04/13/2024   ID:  Thomas Dudley, DOB March 09, 1961, MRN [id number]  PCP:  Gwenn Norris, MD  Cardiologist:  Wilbert Bihari, MD    Referring MD: Gwenn Norris, MD   Chief Complaint  Patient presents with   Coronary Artery Disease   Hypertension   Hyperlipidemia   Sleep Apnea    History of Present Illness:    Thomas Dudley is a 63 y.o. male with a hx of HTN, OSA, dyslipidemia and CAD in the setting of STEMI with PCI of obtuse marginal in 2004 with no nonobstructive disease in other territories.  Repeat cath 04/2008 with  PCI of OM2, PDA and distal RCA.  S/p STEMI with PCI of right PL and normal LVF 08/2012 in [place].  He also has a history of OSA, HTN, PVD, tobacco abuse and dyslipidemia.    He is here for follow-up today and is doing well.  He denies any chest pain or pressure, SOB, DOE, PND, orthopnea, lower extremity edema, dizziness, palpitations or syncope.  He is doing well with his PAP device.  He tolerates the full face mask and feels the pressure is adequate.  Since going on PAP he feels rested in the am and has no significant daytime sleepiness.  He denies any significant mouth or nasal dryness or nasal congestion.  He does not think that he snores. Patient denies any episodes of bruxism, restless legs, No gagging hallucinations or cataplectic events.    Past Medical History:  Diagnosis Date   Allergic rhinitis    Arthritis    Back pain    Coronary atherosclerosis of native coronary artery    post PCI of obtuse marginal in 2004 with no nonobstructive disease in other territories, cath 04/2008 PCI of OM2, PDA and distal RCA, s/p STEMI with PCI of right PL and normal LVF 08/2012 in [place]   Depression    takes Wellbutrin  daily   Diverticulitis    Gastritis    GERD (gastroesophageal reflux disease)    takes Protonix  daily   Hemorrhage of rectum and anus    History of colon polyps    HTN (hypertension)    takes  Lisinopril  and Metoprolol  daily   Hypercholesteremia    takes Lipitora daily   Insomnia    takes Ambien nightly   Insomnia    Joint pain    Myocardial infarction (HCC) 09/10/12   OSA (obstructive sleep apnea)    sleep study done > 54yrs ago;uses CPAP   Pneumonia    hx of ;about 66yrs ago   PVD (peripheral vascular disease)    Tobacco abuse     Past Surgical History:  Procedure Laterality Date   ADENOIDECTOMY     CARDIAC CATHETERIZATION     multiple   CARPAL TUNNEL RELEASE Right    COLONOSCOPY     CORONARY ANGIOPLASTY     total of 6 stents    KNEE ARTHROSCOPY Left 09/22/2013   Procedure: LEFT KNEE ARTHROSCOPY ;  Surgeon: Elspeth JONELLE Her, MD;  Location: [facility] OR;  Service: Orthopedics;  Laterality: Left;   NASAL SEPTOPLASTY W/ TURBINOPLASTY     TONSILLECTOMY      Current Medications: Current Meds  Medication Sig   amitriptyline (ELAVIL) 25 MG tablet Take 75 mg by mouth at bedtime.   aspirin  81 MG tablet Take 1 tablet (81 mg total) by mouth daily.   carvedilol  (COREG ) 12.5 MG tablet TAKE 1 TABLET(12.5 MG) BY  MOUTH TWICE DAILY WITH A MEAL   clopidogrel  (PLAVIX ) 75 MG tablet TAKE 1 TABLET(75 MG) BY MOUTH DAILY   Evolocumab  (REPATHA  SURECLICK) 140 MG/ML SOAJ Inject 140 mg into the skin every 14 (fourteen) days.   lisinopril  (ZESTRIL ) 40 MG tablet TAKE 1 TABLET BY MOUTH DAILY   [DISCONTINUED] amLODipine  (NORVASC ) 10 MG tablet Take 1 tablet (10 mg total) by mouth daily.   [DISCONTINUED] atorvastatin  (LIPITOR) 80 MG tablet TAKE 1 TABLET(80 MG) BY MOUTH DAILY   [DISCONTINUED] nitroGLYCERIN  (NITROSTAT ) 0.4 MG SL tablet DISSOLVE 1 TABLET UNDER THE TONGUE EVERY 5 MINUTES AS NEEDED FOR CHEST PAIN     Allergies:   Neomycin-bacitracin zn-polymyx and Other   Social History   Socioeconomic History   Marital status: Married    Spouse name: Not on file   Number of children: Not on file   Years of education: Not on file   Highest education level: Not on file  Occupational History   Not on  file  Tobacco Use   Smoking status: Every Day    Current packs/day: 1.00    Average packs/day: 1 pack/day for 35.0 years (35.0 ttl pk-yrs)    Types: Cigarettes   Smokeless tobacco: Never  Substance and Sexual Activity   Alcohol use: Yes    Alcohol/week: 0.0 standard drinks of alcohol    Comment: 6pk every 6 months   Drug use: No   Sexual activity: Yes  Other Topics Concern   Not on file  Social History Narrative   Not on file   Social Drivers of Health   Financial Resource Strain: Not on file  Food Insecurity: Not on file  Transportation Needs: Not on file  Physical Activity: Not on file  Stress: Not on file  Social Connections: Unknown (10/11/2021)   [facility]   Social Network    Social Network: Not on file     Family History: The patient's family history includes Breast cancer in his mother; Heart disease in his mother; Lung cancer in his father.  ROS:   Please see the history of present illness.    ROS  All other systems reviewed and negative.   EKGs/Labs/Other Studies Reviewed:    The following studies were reviewed today: 2D echo 06/2018 IMPRESSIONS    1. Left ventricular ejection fraction, by visual estimation, is 55 to  60%. The left ventricle has normal function. There is mildly increased  left ventricular hypertrophy.   2. The left ventricle has no regional wall motion abnormalities.   3. Left ventricular ejection fraction by 3D volume is is 58 %.   4. Global right ventricle has normal systolic function.The right  ventricular size is normal. No increase in right ventricular wall  thickness.   5. Left atrial size was normal.   6. Right atrial size was normal.   7. The mitral valve is normal in structure. Mild mitral valve  regurgitation.   8. The tricuspid valve is normal in structure. Tricuspid valve  regurgitation is not demonstrated.   9. The aortic valve is tricuspid. Aortic valve regurgitation is mild. No  evidence of aortic  valve sclerosis or stenosis.  10. The pulmonic valve was not well visualized. Pulmonic valve  regurgitation is not visualized.  11. The tricuspid regurgitant velocity is 2.20 m/s, and with an assumed  right atrial pressure of 3 mmHg, the estimated right ventricular systolic  pressure is normal at 22.4 mmHg.  12. The inferior vena cava is normal in size with  greater than 50%  respiratory variability, suggesting right atrial pressure of 3 mmHg.   Leixscan myoview  05/2019 Study Highlights  Nuclear stress EF: 49%. The left ventricular ejection fraction is mildly decreased (45-54%). There was no ST segment deviation noted during stress. This is a low risk study. There is no evidence of previous infarction or evidence of ischemia. The study is normal.   Recent Labs: 12/28/2023: ALT 15   Recent Lipid Panel    Component Value Date/Time   CHOL 104 12/28/2023 0814   TRIG 77 12/28/2023 0814   HDL 47 12/28/2023 0814   CHOLHDL 2.2 12/28/2023 0814   CHOLHDL 3.1 06/12/2015 0821   VLDL 23 06/12/2015 0821   LDLCALC 41 12/28/2023 0814   EKG Interpretation Date/Time:  Thursday April 13 2024 14:35:58 EST Ventricular Rate:  75 PR Interval:  210 QRS Duration:  120 QT Interval:  418 QTC Calculation: 466 R Axis:   -41  Text Interpretation: Sinus rhythm with 1st degree A-V block Left axis deviation Minimal voltage criteria for LVH, may be normal variant ( Cornell product ) Cannot rule out Anteroseptal infarct , age undetermined When compared with ECG of 14-Sep-2013 15:11, PR interval has increased QRS duration has increased Minimal criteria for Anteroseptal infarct are now Present QT has lengthened Confirmed by Shlomo Corning ([id number]) on 04/13/2024 2:48:26 PM   Physical Exam:    VS:  BP 126/62   Pulse 75   Ht 5' 11 (1.803 m)   Wt 187 lb 12.8 oz (85.2 kg)   SpO2 98%   BMI 26.19 kg/m     Wt Readings from Last 3 Encounters:  04/13/24 187 lb 12.8 oz (85.2 kg)  12/07/22 202 lb 12.8 oz (92  kg)  10/28/21 192 lb 3.2 oz (87.2 kg)    GEN: Well nourished, well developed in no acute distress HEENT: Normal NECK: No JVD; No carotid bruits LYMPHATICS: No lymphadenopathy CARDIAC:RRR, no murmurs, rubs, gallops RESPIRATORY:  Clear to auscultation without rales, wheezing or rhonchi  ABDOMEN: Soft, non-tender, non-distended MUSCULOSKELETAL:  No edema; No deformity  SKIN: Warm and dry NEUROLOGIC:  Alert and oriented x 3 PSYCHIATRIC:  Normal affect  ASSESSMENT:    1. Coronary artery disease involving native coronary artery of native heart without angina pectoris   2. Primary hypertension   3. OSA (obstructive sleep apnea)   4. Dyslipidemia, goal LDL below 70   5. Bilateral carotid artery stenosis     PLAN:    In order of problems listed above:  ASCAD -s/p remote STEMI with PCI of obtuse marginal in 2004 with nonobstructive disease in other territories.  Repeat cath 04/2008 with  PCI of OM2, PDA and distal RCA.  S/p STEMI with PCI of right PL and normal LVF 08/2012 in [facility] [place] - He has not had any anginal symptoms since I saw him last - Continue aspirin  81 mg daily, atorvastatin  80 mg daily, Plavix  75 mg daily, Carvedilol  12.5mg  BID with as needed refills  HTN -BP controlled on exam today -Continue amlodipine  10 mg daily, Carvedilol  12.5mg  BID,  lisinopril  40 mg daily  with as needed refills -Check BMP today  OSA - The patient is tolerating PAP therapy well without any problems. The PAP download performed by his DME was personally reviewed and interpreted by me today and showed an AHI of 5.9 /hr on auto CPAP from 4-15 cm H2O with 90 % compliance in using more than 4 hours nightly.  The patient has been using and benefiting from  PAP use and will continue to benefit from therapy.  -he has no significant mask leak and changes out the cushion weekly -I will increase his auto CPAP to 8-15cm H2O -check download in 4 weeks  HLD -LDL goal < 70 -I have personally reviewed and  interpreted outside labs performed by patients PCP which showed LDL 41, HDL 47, triglycerides 77 ALT 15 on 12/28/2023 -Continue atorvastatin  80 mg daily, Repatha  140 mg every 14 days with as needed refills  Carotid artery stenosis Left subclavian artery stenosis - Carotid Dopplers 05/20/2021 showed bilateral 1 to 39% carotid artery stenosis and stenotic left subclavian artery - He has no claudication symptoms in his left arm - Repeat carotid Dopplers - Continue aspirin  and statin therapy  Medication Adjustments/Labs and Tests Ordered: Current medicines are reviewed at length with the patient today.  Concerns regarding medicines are outlined above.  Orders Placed This Encounter  Procedures   EKG 12-Lead   Meds ordered this encounter  Medications   amLODipine  (NORVASC ) 10 MG tablet    Sig: Take 1 tablet (10 mg total) by mouth daily.    Dispense:  33 tablet    Refill:  0    Pt needs to keep appt on 04/13/24 with Dr. Shlomo to get future refills   atorvastatin  (LIPITOR) 80 MG tablet    Sig: Take 1 tablet (80 mg total) by mouth daily.    Dispense:  15 tablet    Refill:  0    Please call office to schedule appt with Dr Shlomo for further refills. [phone] Thank you 3rd & Final Attempt   nitroGLYCERIN  (NITROSTAT ) 0.4 MG SL tablet    Sig: Place 1 tablet (0.4 mg total) under the tongue every 5 (five) minutes as needed for chest pain.    Dispense:  75 tablet    Refill:  0   Follow-up in 1 year  Signed, Wilbert Shlomo, MD  04/13/2024 3:02 PM    [facility]

## 2024-04-13 NOTE — Patient Instructions (Signed)
 Medication Instructions:  Your physician recommends that you continue on your current medications as directed. Please refer to the Current Medication list given to you today.  *If you need a refill on your cardiac medications before your next appointment, please call your pharmacy*  Lab Work: Please complete a BMET in our first floor lab today before you leave.  If you have labs (blood work) drawn today and your tests are completely normal, you will receive your results only by: MyChart Message (if you have MyChart) OR A paper copy in the mail If you have any lab test that is abnormal or we need to change your treatment, we will call you to review the results.  Testing/Procedures: Your physician has requested that you have a carotid duplex. This test is an ultrasound of the carotid arteries in your neck. It looks at blood flow through these arteries that supply the brain with blood. Allow one hour for this exam. There are no restrictions or special instructions.   Follow-Up: At [facility], you and your health needs are our priority.  As part of our continuing mission to provide you with exceptional heart care, our providers are all part of one team.  This team includes your primary Cardiologist (physician) and Advanced Practice Providers or APPs (Physician Assistants and Nurse Practitioners) who all work together to provide you with the care you need, when you need it.  Your next appointment:   1 year(s)  Provider:   Wilbert Bihari, MD

## 2024-04-14 ENCOUNTER — Telehealth: Payer: Self-pay | Admitting: *Deleted

## 2024-04-14 ENCOUNTER — Ambulatory Visit: Payer: Self-pay | Admitting: Cardiology

## 2024-04-14 DIAGNOSIS — I2510 Atherosclerotic heart disease of native coronary artery without angina pectoris: Secondary | ICD-10-CM

## 2024-04-14 DIAGNOSIS — I6523 Occlusion and stenosis of bilateral carotid arteries: Secondary | ICD-10-CM

## 2024-04-14 DIAGNOSIS — I10 Essential (primary) hypertension: Secondary | ICD-10-CM

## 2024-04-14 DIAGNOSIS — G4733 Obstructive sleep apnea (adult) (pediatric): Secondary | ICD-10-CM

## 2024-04-14 LAB — BASIC METABOLIC PANEL WITH GFR
BUN/Creatinine Ratio: 15 (ref 10–24)
BUN: 10 mg/dL (ref 8–27)
CO2: 27 mmol/L (ref 20–29)
Calcium: 8.7 mg/dL (ref 8.6–10.2)
Chloride: 101 mmol/L (ref 96–106)
Creatinine, Ser: 0.66 mg/dL — ABNORMAL LOW (ref 0.76–1.27)
Glucose: 75 mg/dL (ref 70–99)
Potassium: 4.3 mmol/L (ref 3.5–5.2)
Sodium: 140 mmol/L (ref 134–144)
eGFR: 105 mL/min/1.73 (ref >59)

## 2024-04-14 NOTE — Telephone Encounter (Signed)
Order placed to [facility] via community message. 

## 2024-04-14 NOTE — Telephone Encounter (Signed)
-----   Message from Wilbert Bihari sent at 04/13/2024  2:56 PM EST ----- increase his auto CPAP to 8-15cm H2O -check download in 4 weeks

## 2024-04-18 ENCOUNTER — Other Ambulatory Visit: Payer: Self-pay | Admitting: Cardiology

## 2024-04-20 ENCOUNTER — Telehealth: Payer: Self-pay | Admitting: Cardiology

## 2024-04-20 NOTE — Telephone Encounter (Signed)
*  STAT* If patient is at the pharmacy, call can be transferred to refill team.   1. Which medications need to be refilled? (please list name of each medication and dose if known) pantoprazole  (PROTONIX ) 40 MG tablet    2. Would you like to learn more about the convenience, safety, & potential cost savings by using the [facility]? No    3. Are you open to using the [facility] ([facility]. No    4. Which pharmacy/location (including street and city if local pharmacy) is medication to be sent to? [id number] - , [place] - [address]     5. Do they need a 30 day or 90 day supply? 30 day

## 2024-04-20 NOTE — Telephone Encounter (Signed)
 Received refill request for protonix . Call to patient as med list shows that he is no longer taking this medication. Patient reports his PCP is who fills this for him. He states he is not sure if he is on it still but he will check tonight and ask his PCP to renew if he is getting low.

## 2024-05-16 ENCOUNTER — Other Ambulatory Visit: Payer: Self-pay | Admitting: Cardiology

## 2024-05-25 ENCOUNTER — Other Ambulatory Visit: Payer: Self-pay | Admitting: Cardiology

## 2024-06-14 ENCOUNTER — Ambulatory Visit (HOSPITAL_COMMUNITY)

## 2024-06-27 ENCOUNTER — Encounter: Payer: Self-pay | Admitting: Cardiology

## 2024-06-27 ENCOUNTER — Ambulatory Visit (HOSPITAL_COMMUNITY)
Admission: RE | Admit: 2024-06-27 | Discharge: 2024-06-27 | Disposition: A | Discharge status: Home / Self Care | Source: Ambulatory Visit | Attending: Cardiology | Admitting: Cardiology

## 2024-06-27 DIAGNOSIS — I6523 Occlusion and stenosis of bilateral carotid arteries: Secondary | ICD-10-CM | POA: Insufficient documentation

## 2024-07-06 ENCOUNTER — Telehealth: Payer: Self-pay | Admitting: Cardiology

## 2024-07-06 MED ORDER — REPATHA SURECLICK 140 MG/ML ~~LOC~~ SOAJ: 140.0000 mg | SUBCUTANEOUS | 3 refills | Status: AC

## 2024-07-06 NOTE — Telephone Encounter (Signed)
" °*  STAT* If patient is at the pharmacy, call can be transferred to refill team.   1. Which medications need to be refilled? (please list name of each medication and dose if known)   Evolocumab  (REPATHA  SURECLICK) 140 MG/ML SOAJ    2. Would you like to learn more about the convenience, safety, & potential cost savings by using the [facility]? no   3. Are you open to using the [facility] ([facility]. no   4. Which pharmacy/location (including street and city if local pharmacy) is medication to be sent to?  [id number] - [place], [place] - [address]    5. Do they need a 30 day or 90 day supply?    Pt is completely out.   "
